# Patient Record
Sex: Female | Born: 1935
Health system: Southern US, Community
[De-identification: ages and names within clinical notes are randomized; demographics above are authoritative.]

## PROBLEM LIST (undated history)

## (undated) DIAGNOSIS — I509 Heart failure, unspecified: Secondary | ICD-10-CM

## (undated) DIAGNOSIS — R413 Other amnesia: Secondary | ICD-10-CM

## (undated) DIAGNOSIS — I10 Essential (primary) hypertension: Secondary | ICD-10-CM

## (undated) DIAGNOSIS — M1990 Unspecified osteoarthritis, unspecified site: Secondary | ICD-10-CM

## (undated) DIAGNOSIS — I4891 Unspecified atrial fibrillation: Secondary | ICD-10-CM

## (undated) DIAGNOSIS — E0590 Thyrotoxicosis, unspecified without thyrotoxic crisis or storm: Secondary | ICD-10-CM

## (undated) DIAGNOSIS — E7800 Pure hypercholesterolemia, unspecified: Secondary | ICD-10-CM

## (undated) HISTORY — PX: TUBAL LIGATION: SHX77

## (undated) HISTORY — DX: Unspecified osteoarthritis, unspecified site: M19.90

## (undated) HISTORY — DX: Thyrotoxicosis, unspecified without thyrotoxic crisis or storm: E05.90

## (undated) HISTORY — DX: Unspecified atrial fibrillation: I48.91

## (undated) HISTORY — DX: Heart failure, unspecified: I50.9

## (undated) HISTORY — DX: Other amnesia: R41.3

## (undated) HISTORY — PX: APPENDECTOMY: SHX54

---

## 2000-04-22 ENCOUNTER — Inpatient Hospital Stay (HOSPITAL_COMMUNITY): Admission: EM | Admit: 2000-04-22 | Discharge: 2000-04-25 | Payer: Self-pay | Admitting: Emergency Medicine

## 2000-04-27 ENCOUNTER — Inpatient Hospital Stay (HOSPITAL_COMMUNITY): Admission: AD | Admit: 2000-04-27 | Discharge: 2000-05-01 | Payer: Self-pay | Admitting: Emergency Medicine

## 2000-04-27 ENCOUNTER — Encounter: Payer: Self-pay | Admitting: Emergency Medicine

## 2000-07-30 ENCOUNTER — Emergency Department (HOSPITAL_COMMUNITY): Admission: EM | Admit: 2000-07-30 | Discharge: 2000-07-30 | Payer: Self-pay | Admitting: Emergency Medicine

## 2003-02-27 ENCOUNTER — Emergency Department (HOSPITAL_COMMUNITY): Admission: EM | Admit: 2003-02-27 | Discharge: 2003-02-27 | Payer: Self-pay

## 2003-04-14 ENCOUNTER — Emergency Department (HOSPITAL_COMMUNITY): Admission: EM | Admit: 2003-04-14 | Discharge: 2003-04-14 | Payer: Self-pay | Admitting: Emergency Medicine

## 2003-04-14 ENCOUNTER — Encounter: Payer: Self-pay | Admitting: Emergency Medicine

## 2004-01-21 ENCOUNTER — Ambulatory Visit (HOSPITAL_COMMUNITY): Admission: RE | Admit: 2004-01-21 | Discharge: 2004-01-21 | Payer: Self-pay | Admitting: Family Medicine

## 2004-01-21 ENCOUNTER — Encounter: Payer: Self-pay | Admitting: Cardiology

## 2004-02-22 ENCOUNTER — Emergency Department (HOSPITAL_COMMUNITY): Admission: EM | Admit: 2004-02-22 | Discharge: 2004-02-22 | Payer: Self-pay | Admitting: Emergency Medicine

## 2004-09-06 ENCOUNTER — Emergency Department (HOSPITAL_COMMUNITY): Admission: EM | Admit: 2004-09-06 | Discharge: 2004-09-06 | Payer: Self-pay | Admitting: Emergency Medicine

## 2004-09-13 ENCOUNTER — Emergency Department (HOSPITAL_COMMUNITY): Admission: EM | Admit: 2004-09-13 | Discharge: 2004-09-13 | Payer: Self-pay | Admitting: Emergency Medicine

## 2004-11-24 ENCOUNTER — Encounter: Admission: RE | Admit: 2004-11-24 | Discharge: 2004-11-24 | Payer: Self-pay | Admitting: Family Medicine

## 2004-11-24 ENCOUNTER — Encounter (INDEPENDENT_AMBULATORY_CARE_PROVIDER_SITE_OTHER): Payer: Self-pay | Admitting: Radiology

## 2004-11-24 ENCOUNTER — Encounter (INDEPENDENT_AMBULATORY_CARE_PROVIDER_SITE_OTHER): Payer: Self-pay | Admitting: *Deleted

## 2004-12-02 ENCOUNTER — Encounter: Admission: RE | Admit: 2004-12-02 | Discharge: 2004-12-02 | Payer: Self-pay | Admitting: General Surgery

## 2004-12-05 ENCOUNTER — Ambulatory Visit: Payer: Self-pay | Admitting: Oncology

## 2004-12-08 ENCOUNTER — Encounter (INDEPENDENT_AMBULATORY_CARE_PROVIDER_SITE_OTHER): Payer: Self-pay | Admitting: Diagnostic Radiology

## 2004-12-08 ENCOUNTER — Encounter (INDEPENDENT_AMBULATORY_CARE_PROVIDER_SITE_OTHER): Payer: Self-pay | Admitting: Specialist

## 2004-12-08 ENCOUNTER — Encounter: Admission: RE | Admit: 2004-12-08 | Discharge: 2004-12-08 | Payer: Self-pay | Admitting: General Surgery

## 2004-12-08 ENCOUNTER — Encounter: Payer: Self-pay | Admitting: Oncology

## 2004-12-29 ENCOUNTER — Ambulatory Visit (HOSPITAL_COMMUNITY): Admission: RE | Admit: 2004-12-29 | Discharge: 2004-12-30 | Payer: Self-pay | Admitting: General Surgery

## 2004-12-29 ENCOUNTER — Encounter (INDEPENDENT_AMBULATORY_CARE_PROVIDER_SITE_OTHER): Payer: Self-pay | Admitting: Specialist

## 2005-02-10 ENCOUNTER — Ambulatory Visit: Payer: Self-pay | Admitting: Oncology

## 2005-02-21 ENCOUNTER — Ambulatory Visit: Admission: RE | Admit: 2005-02-21 | Discharge: 2005-03-13 | Payer: Self-pay | Admitting: Radiation Oncology

## 2005-03-03 ENCOUNTER — Ambulatory Visit (HOSPITAL_COMMUNITY): Admission: RE | Admit: 2005-03-03 | Discharge: 2005-03-03 | Payer: Self-pay | Admitting: Oncology

## 2005-05-25 ENCOUNTER — Ambulatory Visit: Payer: Self-pay | Admitting: Oncology

## 2005-11-23 ENCOUNTER — Ambulatory Visit: Payer: Self-pay | Admitting: Oncology

## 2006-02-13 ENCOUNTER — Encounter: Admission: RE | Admit: 2006-02-13 | Discharge: 2006-02-13 | Payer: Self-pay | Admitting: General Surgery

## 2006-05-17 ENCOUNTER — Ambulatory Visit: Payer: Self-pay | Admitting: Oncology

## 2006-10-30 ENCOUNTER — Encounter: Admission: RE | Admit: 2006-10-30 | Discharge: 2006-10-30 | Payer: Self-pay | Admitting: Cardiology

## 2007-01-11 ENCOUNTER — Emergency Department (HOSPITAL_COMMUNITY): Admission: EM | Admit: 2007-01-11 | Discharge: 2007-01-12 | Payer: Self-pay | Admitting: Emergency Medicine

## 2007-07-01 ENCOUNTER — Ambulatory Visit (HOSPITAL_COMMUNITY): Admission: RE | Admit: 2007-07-01 | Discharge: 2007-07-01 | Payer: Self-pay | Admitting: Cardiology

## 2007-12-18 ENCOUNTER — Encounter: Admission: RE | Admit: 2007-12-18 | Discharge: 2007-12-18 | Payer: Self-pay | Admitting: Cardiology

## 2007-12-24 ENCOUNTER — Ambulatory Visit: Payer: Self-pay | Admitting: Oncology

## 2008-02-17 ENCOUNTER — Emergency Department (HOSPITAL_COMMUNITY): Admission: EM | Admit: 2008-02-17 | Discharge: 2008-02-18 | Payer: Self-pay | Admitting: Emergency Medicine

## 2008-02-17 ENCOUNTER — Encounter: Admission: RE | Admit: 2008-02-17 | Discharge: 2008-02-17 | Payer: Self-pay | Admitting: Oncology

## 2008-02-18 ENCOUNTER — Emergency Department (HOSPITAL_COMMUNITY): Admission: EM | Admit: 2008-02-18 | Discharge: 2008-02-18 | Payer: Self-pay | Admitting: Emergency Medicine

## 2008-06-06 ENCOUNTER — Emergency Department (HOSPITAL_COMMUNITY): Admission: EM | Admit: 2008-06-06 | Discharge: 2008-06-06 | Payer: Self-pay | Admitting: Emergency Medicine

## 2008-06-23 ENCOUNTER — Inpatient Hospital Stay (HOSPITAL_COMMUNITY): Admission: RE | Admit: 2008-06-23 | Discharge: 2008-06-30 | Payer: Self-pay | Admitting: Orthopedic Surgery

## 2008-06-25 ENCOUNTER — Encounter (INDEPENDENT_AMBULATORY_CARE_PROVIDER_SITE_OTHER): Payer: Self-pay | Admitting: Orthopedic Surgery

## 2008-06-25 ENCOUNTER — Ambulatory Visit: Payer: Self-pay | Admitting: Vascular Surgery

## 2008-06-26 ENCOUNTER — Ambulatory Visit: Payer: Self-pay | Admitting: Physical Medicine & Rehabilitation

## 2008-08-10 ENCOUNTER — Encounter: Admission: RE | Admit: 2008-08-10 | Discharge: 2008-08-10 | Payer: Self-pay | Admitting: Orthopedic Surgery

## 2008-10-02 HISTORY — PX: BREAST SURGERY: SHX581

## 2008-12-18 ENCOUNTER — Ambulatory Visit: Payer: Self-pay | Admitting: Oncology

## 2008-12-22 LAB — CBC WITH DIFFERENTIAL/PLATELET
BASO%: 1 % (ref 0.0–2.0)
EOS%: 4.3 % (ref 0.0–7.0)
LYMPH%: 39.3 % (ref 14.0–49.7)
MCHC: 32.1 g/dL (ref 31.5–36.0)
MCV: 83.9 fL (ref 79.5–101.0)
MONO%: 7.1 % (ref 0.0–14.0)
NEUT#: 1.9 10*3/uL (ref 1.5–6.5)
Platelets: 111 10*3/uL — ABNORMAL LOW (ref 145–400)
RBC: 4.09 10*6/uL (ref 3.70–5.45)
RDW: 15.3 % — ABNORMAL HIGH (ref 11.2–14.5)
WBC: 4 10*3/uL (ref 3.9–10.3)

## 2008-12-22 LAB — COMPREHENSIVE METABOLIC PANEL
ALT: 16 U/L (ref 0–35)
AST: 19 U/L (ref 0–37)
Albumin: 3.6 g/dL (ref 3.5–5.2)
Alkaline Phosphatase: 105 U/L (ref 39–117)
Potassium: 3.8 mEq/L (ref 3.5–5.3)
Sodium: 140 mEq/L (ref 135–145)
Total Bilirubin: 0.4 mg/dL (ref 0.3–1.2)
Total Protein: 6.5 g/dL (ref 6.0–8.3)

## 2009-02-17 ENCOUNTER — Encounter: Admission: RE | Admit: 2009-02-17 | Discharge: 2009-02-17 | Payer: Self-pay | Admitting: Oncology

## 2009-03-19 ENCOUNTER — Ambulatory Visit: Payer: Self-pay | Admitting: Oncology

## 2009-03-26 LAB — CBC & DIFF AND RETIC
Basophils Absolute: 0 10*3/uL (ref 0.0–0.1)
EOS%: 2.9 % (ref 0.0–7.0)
Eosinophils Absolute: 0.1 10*3/uL (ref 0.0–0.5)
HCT: 37 % (ref 34.8–46.6)
HGB: 12.1 g/dL (ref 11.6–15.9)
IRF: 0.34 — ABNORMAL HIGH (ref 0.130–0.330)
MCH: 27.8 pg (ref 25.1–34.0)
MCV: 84.9 fL (ref 79.5–101.0)
MONO%: 7.5 % (ref 0.0–14.0)
NEUT#: 1.8 10*3/uL (ref 1.5–6.5)
NEUT%: 48.4 % (ref 38.4–76.8)
RDW: 14.6 % — ABNORMAL HIGH (ref 11.2–14.5)
RETIC #: 44 10*3/uL (ref 19.7–115.1)

## 2009-03-26 LAB — VITAMIN B12: Vitamin B-12: 1407 pg/mL — ABNORMAL HIGH (ref 211–911)

## 2009-03-26 LAB — MORPHOLOGY: PLT EST: DECREASED

## 2009-03-26 LAB — FOLATE: Folate: >20 ng/mL

## 2009-03-26 LAB — FERRITIN: Ferritin: 161 ng/mL (ref 10–291)

## 2009-07-15 ENCOUNTER — Ambulatory Visit: Payer: Self-pay | Admitting: Oncology

## 2009-10-02 HISTORY — PX: JOINT REPLACEMENT: SHX530

## 2010-06-30 ENCOUNTER — Ambulatory Visit: Payer: Self-pay | Admitting: Oncology

## 2010-07-04 LAB — CBC WITH DIFFERENTIAL/PLATELET
BASO%: 0.6 % (ref 0.0–2.0)
EOS%: 2.6 % (ref 0.0–7.0)
HCT: 36.7 % (ref 34.8–46.6)
MCH: 29.1 pg (ref 25.1–34.0)
MCHC: 33.5 g/dL (ref 31.5–36.0)
MONO#: 0.4 10*3/uL (ref 0.1–0.9)
NEUT%: 54.2 % (ref 38.4–76.8)
RBC: 4.22 10*6/uL (ref 3.70–5.45)
RDW: 14.9 % — ABNORMAL HIGH (ref 11.2–14.5)
WBC: 4.5 10*3/uL (ref 3.9–10.3)
lymph#: 1.5 10*3/uL (ref 0.9–3.3)

## 2010-07-04 LAB — COMPREHENSIVE METABOLIC PANEL
ALT: 16 U/L (ref 0–35)
AST: 25 U/L (ref 0–37)
Albumin: 4 g/dL (ref 3.5–5.2)
CO2: 26 mEq/L (ref 19–32)
Calcium: 9.1 mg/dL (ref 8.4–10.5)
Chloride: 105 mEq/L (ref 96–112)
Potassium: 3.9 mEq/L (ref 3.5–5.3)
Sodium: 140 mEq/L (ref 135–145)
Total Protein: 6.8 g/dL (ref 6.0–8.3)

## 2010-07-04 LAB — CANCER ANTIGEN 27.29: CA 27.29: 31 U/mL (ref 0–39)

## 2010-09-20 ENCOUNTER — Encounter
Admission: RE | Admit: 2010-09-20 | Discharge: 2010-09-20 | Payer: Self-pay | Source: Home / Self Care | Attending: Oncology | Admitting: Oncology

## 2010-10-23 ENCOUNTER — Encounter: Payer: Self-pay | Admitting: Oncology

## 2011-02-14 NOTE — Op Note (Signed)
[name], [name]             ACCOUNT NO.:  [id number]   MEDICAL RECORD NO.:  [id number]          PATIENT TYPE:  INP   LOCATION:  5013                         FACILITY:  [facility]   PHYSICIAN:  Lenard Galloway. Mortenson, M.D.DATE OF BIRTH:  03-22-1932   DATE OF PROCEDURE:  06/23/2008  DATE OF DISCHARGE:                               OPERATIVE REPORT   PREOPERATIVE DIAGNOSIS:  End-stage osteoarthritis left knee.   POSTOPERATIVE DIAGNOSIS:  End-stage osteoarthritis left knee.   OPERATION:  Left total knee with computer navigation using a DePuy  standard plus cemented left femoral component and a standard plus  cemented 10-mm poly insert with a standard plus cemented metal-backed  patella and an MBT Keel tibial tray size 3 cemented.   SURGEON:  Lenard Galloway. Chaney Malling, MD   ASSISTANT:  Oris Drone. Petrarca, PA-C   ANESTHESIA:  General.   PROCEDURE:  The patient was placed on the operating table in a supine  position with a pneumatic tourniquet above the left upper thigh.  The  entire left lower extremity was prepped with DuraPrep and draped out in  usual manner.  A Vi-Drape was placed over the operative site.  The leg  was wrapped down in Esmarch and tourniquet was elevated.  An incision  made starting above the patella and carried down to the tibial tubercle.  Skin edges were retracted.  Bleeders were coagulated.  A long medial  parapatellar incision was made and the patella was everted.  Large  osteophytes were seen over both medial and lateral condyles and these  were debrided.  The osteophytes around the rim of the patella were also  removed.  The femur was then sized to be a standard plus size.  A  partial synovectomy was done.  Knee was flexed.  The cruciate ligaments  were released and both medial and lateral meniscus were excised.  Once  the soft tissue was debrided and access was achieved, Schanz pins were  placed in the distal femur and proximal tibia in a standard manner and  the  femoral and tibial arrays were applied.  Registration was then done  to find the mechanical axis first the femoral head, then the tibial  mechanical axis.  Then we defined the proximal tibial plateau and the  femoral condyles and epicondyles.  This was all inserted into computer  and then tibial plane was done.  The tibial resection settings were then  registered and a tibial resection guide was placed over the anterior  aspect of the tibia using the computer prompting.  The block was held in  appropriate position and fixation pins were inserted.  A capture guide  was then placed on the cutting block and the proximal end of the tibia  was resected.  A confirmation guide was then used and final  _tibial_________cuts were made in  the proximal tibia.  At this point,  the tibial array became loose and could not be stabilized and the tibial  and femoral arrays were removed and we proceeded without the computer.  The attention was then turned to the distal femur.  The knee flexed 90  degrees, the tibial cutting block #1 was placed over the distal end of  the femur.  A drill hole was placed through the cutting block up into  the femoral canal and this was locked in position.  Anterior and  posterior cuts were then made.  Once this was accomplished, insert was  put in the knee with the knee flexed 90 degrees and there was excellent  soft tissue balancing of both the medial, lateral and collateral  ligaments.  Cutting block #2 was placed over the distal end of the femur  on top of the intramedullary rod.  This was put in position and the knee  was put in extension and 10 mm spacer block was put in place.  So, a  proper length was been amputated.  The cutting block was locked in  position with fixation pins and distal end of the femur was amputated.  Once this was accomplished, the cutting block was removed and the spacer  block was put in position.  A 10-mm spacer block balanced the collateral   ligaments extremely well.  There was excellent alignment.  The long rod  was placed down through the handle of the spacer block and this lined up  very nice between the first and second metatarsals.  There was excellent  stability with varus and valgus stress in flexion and extension.  The  final femoral cutting block was placed over the distal end of the femur  and locked in position.  Chamfer cuts were made and drill holes were  made.  Once this was accomplished, attention was then turned to the  proximal tibia.  The tibia was subluxed anteriorly using __________  retractors.  A size 3 tibial tray was placed over the proximal end of  the tibia and fixation pins were put in place.  The smoke stack drill  guide was locked in position and a drill was placed on proximal tibia.  The smoke stack was removed and the winged keel broach was passed down  the proximal tibia.  Once this was accomplished, a 10-mm trial poly was  inserted and the standard plus femoral component was selected and put  over the distal end of the femur.  Knee was then articulated and put  through a full range of motion.  There was excellent stability in  extension and with flexion, there was excellent alignment of poly.  There was no tendency to sublux.  Her AP drawer was negative, no  rotation abnormality was noted.  Knee was put through full range of  motion and extremely stable.  Attention was then turned to the posterior  aspect of the patella.  Cutting block was placed to the posterior  aspect.  Posterior aspect of the patella was amputated and drill guide  was placed and three holes were made in the cut surface of the patella.  The patella trial was inserted and the knee articulated again.  There  was tightness in the lateral ligaments and a small lateral release was  done.  Once this was accomplished, the patella tracked very nicely in  midline.  All the trial components were removed.  The knee was then  irrigated  with copious amounts of saline solution, using pulsatile  lavage, all debris was removed.  Once this was accomplished to my  satisfaction, the final component was selected, put on the back table  and glue was mixed.  Glue was inserted on the posterior aspect of all  the components.  Attention then turned to the proximal end of the cut  surface of the tibia.  Excess glue was placed over the cut surface and  passed down into the medullary hole.  A tibial plate was inserted and  impacted tightly and excess glue was removed.  Trial poly was inserted  and glue was placed over the distal end of the femur in posterior aspect  of the femoral component.  The femoral component was then reduced and  excess glue was removed.  The knee was put through full range of motion  and again there was excellent stability.  The leg was held in extension  and glue was placed in posterior aspect of the patella.  The patella  component was then inserted and held in place with a patella clamp.  All  excess glue was removed with small curettes and glue removers.  Once the  glue had cured, the trial poly was removed from the tibial plate.  Knee  was flexed and excess glue was removed with an osteotome.  The knee was  irrigated again with copious amounts of saline solution.  All debris was  removed.  FloSeal was inserted and allowed to set up.  Once this was  accomplished, tourniquet was dropped and bleeders were coagulated.  There was no acute bleeding posterior over the posterior capsule.  The  Bovie was used.  Bone wax was used to reduce bleeding from the drill  holes in the femur where the Schanz pins had been removed.  A final  tibial poly was then inserted in place and reduced.  The knee was put  through a full range of motion.  There was very little bleeding.  A long  medial parapatellar incision that was closed with interrupted heavy  Ethibond sutures.  Vicryl was used to close the subcutaneous tissue and   stainless steel staples were used to close the skin.  Sterile dressings  were applied and the patient returned to recovery room in excellent  condition.  Technically, this went extremely well.   DRAINS:  None.   COMPLICATIONS:  None.      Rodney A. Chaney Malling, M.D.  Electronically Signed     RAM/MEDQ  D:  06/23/2008  T:  06/24/2008  Job:  [id number]

## 2011-02-14 NOTE — Discharge Summary (Signed)
[name], [name]             ACCOUNT NO.:  [id number]   MEDICAL RECORD NO.:  [id number]          PATIENT TYPE:  INP   LOCATION:  5013                         FACILITY:  [facility]   PHYSICIAN:  Lenard Galloway. Mortenson, M.D.DATE OF BIRTH:  Jun 29, 1932   DATE OF ADMISSION:  06/23/2008  DATE OF DISCHARGE:  06/29/2008                               DISCHARGE SUMMARY   ADMISSION DIAGNOSIS:  Osteoarthritis of left knee.   DISCHARGE DIAGNOSES:  1. Osteoarthritis of left knee.  2. History of hypertension.  3. History of right breast cancer.  4. Acute blood loss anemia.   PROCEDURE:  Left total knee arthroplasty.   HISTORY:  Cynthia Copeland is a 77 year old African American female with  long standing left leg and left knee pain.  The pain is severe, sharp,  stabbing and aching pain.  It is worsening and she now is having  problems with ambulation and activities of daily living.  She now uses  narcotics for pain.  She does have a limp.  Radiographic end-stage  osteoarthritis of left knee.  Has failed conservative treatment and is  for left total knee arthroplasty.   HOSPITAL COURSE:  A 77 year old female admitted June 23, 2008 after  appropriate laboratory studies were obtained, as well as 1 gram of  vancomycin IV on call to the operating room, was taken to the operating  room where she underwent a left total knee arthroplasty with computer  navigation.  She tolerated the procedure well.  She was continued on  vancomycin 1 gram IV q.12h. x1 more dose.  The patient was placed on a  reduced dose of Dilaudid PCA pump for pain management.  Arixtra 2.5 mg  subcutaneously Q. 8 A.M. beginning on June 24, 2008.  Foley was  placed intraoperatively.  Consults with PT, OT and care management were  made.  She was allowed to be weight-bearing as tolerated on the left.  CPM 0 to 60 degrees for 6 to 8 hours per day, increasing by 5 to 10  degrees per day.  She was allowed out of bed to chair the  following day.  She did have some decrease in her urine output.  IV fluids were  increased.  Her BUN and creatinine had risen in the first several days  postoperatively but then slowly returned to normal.  We did have some  difficulty with pain management on her and we started her on Butorphanol  nasal spray, one spray in one nostril q.4h. p.r.n. severe pain.  Her  hemoglobin had dropped to 7.2 on June 25, 2008 and she was typed  and crossed for 2 units of packed red blood cells and this was given,  each over two to three hours.  Lasix 10 mg IV between the units was  given.  Her dressing was changed.  Her wounds remained clean and dry  during her hospital course. A Doppler was done of the left lower leg on  June 25, 2008 and this was negative.  On June 25, 2008 she  also informed us that her grandson, who was to be taking care of her at  home, was going out of town for two weeks and would no longer be able to  take care of her and therefore she wanted to have rehab consult.  At  that time we asked for a rehab consult on June 26, 2008 and it was  felt that she was a skilled nursing facility candidate.  On June 26, 2008 we did do a HIT screen which was positive on the screen but  after a discussion with the pharmacist, it was felt that the Arixtra was  continuing to be safe for this patient since it is not a heparin based  product.  Urinalysis was also ordered on June 26, 2008.  The  remainder of her hospital stay over the weekend was unremarkable.  It  was felt that she was a candidate for discharge to a skilled nursing  facility as soon as a bed was available.   LABORATORY DATA:  She was admitted with a urine culture that showed no  growth preoperatively.  Blood type was O positive, antibody screen  negative.  Preoperative hemoglobin was 12.1, hematocrit 36.4%, white  count 4,300, platelet count 206,000.  Discharge hemoglobin was 9.4,  hematocrit 27.9,  white blood cell count 7,400, platelet count 104,000.  Preoperative sodium was 139, potassium 4.2, chloride 104, CO2 29,  glucose 96, BUN 17, creatinine 0.73.  GFR greater than 20.  Total  bilirubin 0.7, alkaline phosphatase 85, SGOT 23, SGPT 17,  total protein  7.0, albumin 3.5 and calcium 8.9.  Discharge sodium 137, potassium 3.7,  chloride 105, CO2 25, glucose 113, BUN 11, creatinine 0.99.  On  radiographic studies, chest x-ray of Feb 18, 2008 revealed stable  cardiomegaly with previous right mastectomy, no acute findings.  EKG of  June 23, 2008 reveals a sinus rhythm with short PR, otherwise  normal.   DISCHARGE INSTRUCTIONS:   WOUND CARE:  She is to keep the incision clean and dry, cover the wound  daily with 4x4's.  She may shower.   DIET:  She may be on a regular diet.   ACTIVITY:  She can increase her activity slowly.  No lifting or driving  for six weeks.  Physical therapy for ambulation, weight-bearing as  tolerated with a walker.  Also to do range of motion strengthening  exercises.  CPM 0 to 80 degrees for 6 to 8 hours per day, increasing 5  to 10 degrees per day.   DISCHARGE MEDICATIONS:  1. Prescription for Percocet 5/325 one to two tabs every 4 hours as      needed for pain.  2. Robaxin 500 mg one tablet every 8 hours as needed for spasms.  3. Arixtra 2.5 mg injected daily at 8 A.M.; last dose being June 30, 2008.  At that time, she is to begin aspirin 81 mg daily on      July 01, 2008 for one month.  4. She is to continue her amlodipine.  5. Benazepril 20 mg q.a.m. Please check with her to make sure the      dosing is right on this, once daily.  6. Clonidine 0.1 mg b.i.d.  7. Tamoxifen 20 mg one daily.  8. Butorphanol 10 mg one spray in one nostril q.4-6h. p.r.n.      breakthrough pain.  This should be used only on a very limited      basis.   FOLLOWUP:  She will need followup with Dr. Chaney Malling on July 06, 2008.  An  appointment can be  made by calling [phone].  Call for any problems  with increasing temperature, wound changes or calf pain which is  increasing.   CONDITION ON DISCHARGE:  She is discharged in improved condition.      Cynthia Copeland, P.A.-C.      Rodney A. Chaney Malling, M.D.  Electronically Signed    Cynthia Copeland/MEDQ  D:  06/29/2008  T:  06/29/2008  Job:  [id number]

## 2011-02-17 NOTE — Discharge Summary (Signed)
[place]  Patient:    Cynthia Copeland, Cynthia Copeland                    MRN: [id number] Adm. Date:  [id number] Disc. Date: [id number] Attending:  Roque Lias Dictator:   Dianah Field, P.A.C. CC:         Hedwig Morton. Juanda Chance, M.D. [facility]             Reuben Likes, M.D.                           Discharge Summary  DATE OF BIRTH:  12-19-1931.  ADMISSION DIAGNOSES: 1. Acute gastrointestinal bleed felt to be upper intestinal in origin.  No    associated abdominal pain. 2. Elevated BUN likely secondary to blood within the gastrointestinal tract. 3. High blood pressure. 4. Anemia secondary to gastrointestinal bleed.  Rule out additional chronic    anemia secondary to chronic gastrointestinal blood loss. 5. Status post total abdominal hysterectomy, status post appendectomy. 6. Rule out urinary tract infection. 7. Status post left knee arthroscopy.  DISCHARGE DIAGNOSES: 1. Gastrointestinal bleed secondary to antral gastric ulcer. 2. Gastritis.  CLOtest biopsy negative. 3. Anemia associated with gastrointestinal bleed.  Status post transfusion    with two units of packed red blood cells. 4. Azotemia secondary to blood within the gastrointestinal tract, resolved. 5. Iron deficiency.  The patient started on iron supplementation during this    admission.  Folate and B12 levels within normal limits. 6. Rule out urinary tract infection.  Urine culture with no growth during this    admission.  PROCEDURES:  Upper endoscopy by Hedwig Morton. Juanda Chance, M.D., on April 23, 2000. Findings included benign-appearing gastric ulcer in the antrum with a hemorrhagic spot consistent with recent bleeding.  Gastritis.  CONSULTATIONS:  Reuben Likes, M.D., followed the patient for hypertension while she was hospitalized.  BRIEF HISTORY:  Cynthia Copeland is a 77 year old African-American female who developed painless large volume loose bloody stools.  These occurred on the day of  admission.  She had no associated nausea, vomiting or abdominal pain. However, she did fell she was complaining of weakness last week and dizziness near the time of admission. She had not had prior blood per rectum or melanic stool.  She gave a history of heartburn and indigestion which she treats p.r.n. with Alka-Seltzer.  She was also using daily aspirin along with occasional nonsteroidal Aleve or ibuprofen.  On arrival to the emergency room blood pressure was actually elevated at 139/101.  Pulse was 96.  She was mentating well and in no distress.  Dr. Lina Sar admitted the patient for further work-up and supportive care.  LABORATORIES:  Initial hemoglobin was 8.7.  It dropped down to 7.4 and measured 9.5 at discharge.  Hematocrit low of 22.8, MCV normal at 83.5, platelets initially 168, did drop to 129.  PT was 14.7, INR 1.3, PTT 26. Fecal occult blood testing was positive.  Helicobacter screening was negative for evidence for H. pylori.  Urinalysis was contaminated with many epithelial cells, moderate amount of leukocyte esterase and many bacteria were present. However, urine culture showed no growth.  The iron level was reduced at 36. Total iron binding capacity normal at 263 and iron saturation reduce at 14. B12 was 564, folate 17.7, both of which are normal.  HOSPITAL COURSE:  The patient was admitted in the afternoon of April 22, 2000.  She was up to a telemetry bed on 5500 by that evening.  She was started on IV fluids in the emergency room and these were continued.  Serial hemoglobin and hematocrits were obtained.  Ultimately the patient required transfusions of two units of packed red blood cells for drop in her hemoglobin.  She was limited to clear liquid diet.  The patient underwent her upper endoscopy by Dr. Juanda Chance with findings noted above.  Because of the visible vessel within the ulcer bed, she was at higher risk for rebleeding and thus was maintained on telemetry  monitoring with serial CBCs.  She did not require further transfusions.  She did not become unstable.  Blood pressure initially had been elevated and she was restarted on her usual hydrochlorothiazide.  She was also started on Inderal inadvertently because of misunderstanding as to what medication she took, Dr. Juanda Chance wrote for this medication.  However, per Dr. Ginny Forth records, she does not use this medication.  This presented a problem as the Inderal was felt to have induced some noticeable fatigue on the patients part and was discontinued on the day of her discharge.  Blood pressure was kept under good control and she had no evidence for rebleeding for at least 48 hours prior to discharge.  Stools were heme-positive on the day of her discharge but by visual observation they were not melanic.  She had been started on a regimen of Nu-Iron in order to supplement her iron deficiency.  She was tolerating a solid food diet and was discharged to home in the afternoon of April 25, 2000.  CONDITION AT DISCHARGE:  Stable and improved.  FOLLOW-UP:  The patient is to call Dr. Ginny Forth office to arrange office visit for the end of this present week for recheck of her blood pressure.  Regarding GI, she has an appointment on Monday, May 14, 2000, at the [facility] third floor [facility] office for preoperative clearance in anticipation of upper endoscopy which has been set up for May 29, 2000, at 2 p.m., also at [facility].  DIET AT DISCHARGE:  She will be on a regular diet.  MEDICATIONS AT DISCHARGE: 1. Protonix 40 mg one p.o. q.d. 2. Nu-Iron one p.o. q.d. 3. Hydrochlorothiazide 50 mg one p.o. q.d. 4. Vitamins A, B and B12 can be taken as they were before. 5. Regarding vitamin C, she is to restart this in two weeks. 6. Regarding aspirin, she is not to restart her daily 325 mg aspirin until    one of her physicians says it is okay to restart this. 7. She is also not to use any Aleve or  ibuprofen. 8. She can use Tylenol if needed for analgesia. DD:  04/25/00 TD:  04/27/00 Job: [id number]  [id number]

## 2011-02-17 NOTE — Procedures (Signed)
[place]. [facility]  Patient:    Cynthia Copeland, Cynthia Copeland                    MRN: [id number] Proc. Date: 04/23/00 Adm. Date:  [id number] Attending:  Roque Lias CC:         Reuben Likes, M.D.                           Procedure Report  PROCEDURE:  Upper endoscopy.  ENDOSCOPIST:  Hedwig Morton. Juanda Chance, M.D. [facility]  INDICATIONS:  This 77 year old white female patient of Dr. Lorenz Coaster presented with painless, large-volume hematochezia, mostly dark red stool which was loose.  She had an episode of dizziness about a week prior to that, but she denied any abdominal pain.  The patient has been on aspirin 1 a day, 81 mg alternating with 325 mg.  She has no previous GI history.  Her hemoglobin was 8.7 g on admission.  Her BUN was 39, indicating possible upper GI source of bleeding.  Her stool was clearly hemoccult positive.  Her hemoglobin dropped to 7.4 during the night.  She has passed another melanic stool.  The patient is undergoing upper endoscopy for further localization of her bleeding.  ENDOSCOPE:  Fujinon single-channel endoscope.  SEDATION:  Versed 10 mg IV.  FINDINGS:  Esophagus.  DESCRIPTION OF PROCEDURE:  The Fujinon single-channel endoscope was passed through the posterior pharynx into the esophagus.  The patient was monitored by pulse oximeter.  Her oxygen saturations were normal.  Proximal and distal esophageal mucosa was unremarkable with normal-appearing squamocolumnar junction.  There was no hiatal hernia.  Stomach: The stomach was insufflated with air.  There was no blood in the stomach, but mucosa of the gastric antrum was erythematous with several erosions.  There were some specks of old blood coating the gastric antrum. There was a 1 cm shallow gastric ulcer with a visible vessel which was not bleeding.  There was a flat red spot which did not protrude.  There was no blood clot or heaped up material.  ______ were clean and flat.   Surrounding mucosa was somewhat friable.  Pyloric cap was normal.  Duodenum:  The duodenal bulb and descending duodenum were normal.  CLOtest was taken from gastric antrum.  Retroperitoneal view endoscope revealed normal fundus and cardia.  Endoscope was then withdrawn.  The stomach was decompressed.  The patient tolerated the procedure well.  IMPRESSION: 1. Benign-appearing gastric ulcer in the gastric antrum with hemorrhagic spot    consistent with recent bleeding. 2. Gastritis status post CLOtest.  PLAN:  Since the patient is not bleeding actively, I would treat her conservatively with bowel rest and acid suppressing agents.  Await results from the CLOtest.  The patient was advised not to take any aspirin in the future.  Depending on the biopsies and CLOtest, she may need some long-term chronic acid suppressing agents. DD:  04/23/00 TD:  04/23/00 Job: [id number]

## 2011-02-17 NOTE — H&P (Signed)
[place]. [facility]  Patient:    Cynthia Copeland, Cynthia Copeland                    MRN: [id number] Adm. Date:  [id number] Attending:  Roque Lias Dictator:   Dianah Field, P.A. CC:         Dr. Lorenz Coaster             Dr. Juanda Chance                         History and Physical  DATE OF BIRTH: 04-20-1932  CHIEF COMPLAINT: Painless bloody bowel movement.  HISTORY OF PRESENT ILLNESS: Ms. Cynthia Copeland is a pleasant 77 year old African-American female with no prior GI history.  She is generally healthy, with no active medical problems other than leg cramps.  On the day of admission at about mid day to early afternoon she passed a large volume of loose bloody stool which was dark red.  She had no associated abdominal cramps or pain.  She denies nausea or vomiting.  She denies dizziness or chest pain. She presented to the emergency room for evaluation and was referred to Dr. Lina Sar.  On evaluation her vital signs were stable.  She was alert and oriented and in no distress.  The abdomen was nontender.  There was maroon guaiac positive stool on rectal examination.  Hemoglobin was 8.7, WBC was normal, as were coags.  Her BUN was elevated at 39.  Dr. Juanda Chance admitted her for supportive care, serial blood counts, and upper and possibly lower endoscopy to locate the source of the GI bleed.  PAST MEDICAL HISTORY:  1. She has "young" cataracts, with preserved night vision.  2. She has a history of lower extremity cramping, which is worse on the left,     treated with Quinine.  3. She is status post left knee arthroscopy, which occasionally causes her     pain.  She does use Aleve to treat the pain two to three times weekly.  4. She is status post total abdominal hysterectomy.     a. History of hemorrhage perioperatively following hysterectomy requiring        transfusions with what sounds like packed red blood cells as well as        FFP.  The date of this surgery is  unclear but it was at least ten years        ago.  5. She is status post appendectomy.  6. Gravida 4, para 3, one spontaneous abortion.  ALLERGIES: No known drug allergies.  SOCIAL HISTORY: The patient has been a widow for nearly a year.  The anniversary of her husbands death is coming up this 2023/05/28.  She quit smoking ten years ago.  She does not consume alcoholic beverages.  She works at [employer] in [employer].  CURRENT MEDICATIONS:  1. Quinine sulfate as-needed for leg cramps.  2. She takes the following vitamins daily:     a. Vitamin A.     b. Vitamin D.     c. Vitamin C.     D. Vitamin B-12.  3. Over-the-counter iron about three times weekly.  4. Aleve and/or Advil as-needed two to three times weekly at the most.  5. Aspirin 325 mg 1 p.o. q.d.  FAMILY HISTORY: Her mother died at age 29 with a blood clot (sounds like DVT, questionable pulmonary embolus).  Father died at age 28  with old age.  He had adult onset diabetes mellitus.  REVIEW OF SYSTEMS: The patient wears glasses for reading and distance vision. She denies significant cough or shortness of breath.  She is able to mow her lawn and do all her yard work without significant problems.  She reports no chest pain with exertion.  She occasionally has abdominal bloating which she treats with p.r.n. Alka-Seltzer.  She has not used this in several weeks.  She denies urinary incontinence or dysuria.  She has left knee stiffness and pain on occasion.  PHYSICAL EXAMINATION:  GENERAL: The patient is a well-appearing African-American female who is alert and oriented.  She provides a good history.  VITAL SIGNS: Blood pressure 139/101, pulse 96, temperature 97.2 degrees, respirations 20.  Weight by scales is 76.9 kg.  HEENT: PERRL.  EOMI.  No scleral icterus.  No conjunctival pallor.  Oropharynx is moist and clear with many teeth missing.  NECK: Without mass or thyromegaly.  No bruits.  CHEST: Clear to  auscultation and percussion bilaterally.  COR: Rhythm is slightly rapid and regular without murmurs, rubs, or gallops.  ABDOMEN: Soft, nontender, with active bowel sounds.  Liver edge is smooth and palpable at the costal margin.  There is no costovertebral angle tenderness. No mass or hepatosplenomegaly.  There is a low vertical midline scar consistent with hysterectomy that is well-healed.  RECTAL: Notable for maroon guaiac positive stool.  No masses and good rectal tone.  EXTREMITIES: There is trace nonpitting edema of the left foot.  Dorsalis pedis pulses are 3+ bilaterally.  MUSCULOSKELETAL: Some slight swelling in the left knee which is nontender.  No gross joint deformities.  DERMATOLOGIC: No bruising or rashes or lesions.  NEUROLOGIC: No tremors.  The patient is able to stand and walk without assistance.  LABORATORY DATA: Urinalysis shows a small amount of leukocyte esterase and negative nitrites, 30 mg protein and 15 mg ketones present, large amount hemoglobin; many epithelial cells present; 6-10 wbc/hpf, 0-5 rbc/hpf; many bacteria on microscopic examination.  PT 14.7, INR 1.3, PTT 26 seconds.  WBC 6.6, hemoglobin 8.7, hematocrit 26.4, MCV 84.4; platelets 168,000. Electrolytes show a potassium of 3.7, sodium 140, glucose 134.  BUN elevated at 39, creatinine 0.8.  Albumin low at 2.6.  Bilirubin 0.7.  Alkaline phosphatase 75, AST 29, ALT 24.  IMPRESSION:  1. Gastrointestinal bleed.  Although there is red blood in the rectal     vault the patient does have elevated BUN which suggests an upper     gastrointestinal bleed.  Rule out aspirin related peptic ulcer disease.     Rule out lower gastrointestinal source of bleeding such as diverticulosis.  2. Anemia secondary to gastrointestinal bleed.  Questionable as to whether     she has had chronic blood loss on top of acute blood loss as she seems to     be tolerating the gastrointestinal bleed quite well, suggesting that her      blood counts may have been depressed to start with.   3. Hyperglycemia with family history of adult onset diabetes mellitus.  Needs     outpatient monitoring.  4. High blood pressure.  Needs monitoring.  5. Status post left knee arthroscopy with possible degenerative joint disease     in the left knee requiring periodic nonsteroidals.  PLAN: The patient is to be admitted to a telemetry monitored bed and we will plan to transfuse her if her hemoglobin reaches less than 8.5.  We will also check iron  studies.  Plan upper endoscopy within the next 12-24 hours and if this does not show a source for the bleed then she will need to undergo colonoscopy.  Plan to hold aspirin and begin Protonix IV and limit her diet to sips of clear liquids. DD:  04/23/00 TD:  04/23/00 Job: [id number]

## 2011-02-17 NOTE — H&P (Signed)
[place]. [facility]  Patient:    Cynthia Copeland, Cynthia Copeland                    MRN: [id number] Adm. Date:  [id number] Attending:  Roque Lias                         History and Physical  CHIEF COMPLAINT:  "Tired and dizzy."  HISTORY OF PRESENT ILLNESS:  The patient is a 77 year old female who came to my office today for a blood pressure recheck and was found to be in atrial fibrillation with rapid ventricular response.  She was hospitalized here for a GI bleed earlier this week.  Endoscopy revealed an ulcer.  She was transfused with packed red blood cells and discharged three days prior to admission. During her hospital stay she was monitored continuously by telemetry and was in normal sinus rhythm throughout her hospital stay up until the time of discharge. She returned to my office this morning for routine blood pressure checks since her blood pressure had been mildly elevated while in the hospital.  She stated that since she awoke this morning she felt tired, weak, fatigued and dizzy. She denied any sensation of rapid or irregular heart beat, chest pain, pressure or tightness or shortness of breath.  There was no known history of prior episodes of atrial fibrillation or any other cardiac problems and she has had no obvious GI bleeding since her discharge.  On physical examination her cardiac rhythm was rapid and irregular and EKG confirmed the presence of atrial fibrillation with rapid ventricular response. She is admitted now for further evaluation and rate control.  PAST SURGICAL HISTORY:  The patient had a cesarean section and Bilateral tubal ligation in 1997.  PAST MEDICAL HISTORY:  Other hospitalizations as above.  Illnesses: The patient has been hypertensive since 1997.  MEDICATIONS:  Hydrochlorothiazide 50 mg once a day and a GI medication once a day.  ALLERGIES:  The patient is allergic to penicillin.  FAMILY HISTORY:  The patients  father died at age 104 of complications of diabetes. Mother died at age 61 of a pulmonary embolus. She has one brother alive and well. Another brother died at age 27 possibly of exposure to toxic chemicals.  She has three sisters, one died at age 40 of breast cancer, the other two are alive and well and three children all alive and well.  SOCIAL HISTORY:  The patient was widowed 11 months ago.  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 She has never used alcohol.  She quit smoking in 1990 and has not used any tobacco since then.  REVIEW OF SYSTEMS:  Denies other systemic, skin, eye, ENT, respiratory, cardiovascular, GI, GU, musculoskeletal or neurological complaints.  PHYSICAL EXAMINATION:  VITAL SIGNS:  Blood pressure 120/70, pulse 150 and irregular, respirations 20, temperature 97.5.  GENERAL: Alert and in no distress.  SKIN: Warm and dry.  No rash.  HEENT: Eyes: Pupils equal, round and reactive to light.  Full EOMs.  Fundi benign. Sclerae nonicteric.  Conjunctivae slightly pale.  ENT:  TMs normal. No intraoral lesions. Pharynx clear.  Mucous membranes are moist.  NECK: Supple. No adenopathy, JVD or bruit. Thyroid normal.  Carotid pulses are full.  LUNGS:  Clear to auscultation and percussion.  HEART:  Rapid irregular rhythm without gallops, murmurs or rubs.  ABDOMEN: Soft and nontender without organomegaly  or mass.  Bowel sounds normally active.  RECTAL: No masses and stool hemoccult negative.  EXTREMITIES:  No edema.  Right pedal pulse was not felt.  NEUROLOGIC:  Alert, oriented times three.  Speech was clear and appropriate. No extremity weakness or tremor.  DTRs 2+ and symmetrical. Babinskis downgoing. Cranial nerves intact.  ADMITTING IMPRESSION: 1. Atrial fibrillation new onset with uncontrolled ventricular rate. 2. Duodenal ulcer with recent gastrointestinal bleed. 3. Hypertension.  PLAN: 1. Discuss  anticoagulation with Dr. Juanda Chance.  She felt it was probably    save to anticoagulate at this time. 2. Rate control with Cardizem drip. 3. Cardiology consult. DD:  04/27/00 TD:  04/28/00 Job: [id number]

## 2011-02-17 NOTE — Op Note (Signed)
NAMEMAEGEN, WIGLE             ACCOUNT NO.:  [id number]   MEDICAL RECORD NO.:  [id number]          PATIENT TYPE:  OIB   LOCATION:  5710                         FACILITY:  [facility]   PHYSICIAN:  Leonie Man, M.D.   DATE OF BIRTH:  [birth year]   DATE OF PROCEDURE:  12/29/2004  DATE OF DISCHARGE:                                 OPERATIVE REPORT   PREOPERATIVE DIAGNOSIS:  Multicentric invasive carcinoma of the right  breast.   POSTOPERATIVE DIAGNOSIS:  Multicentric invasive carcinoma of the right  breast.   PROCEDURE:  Modified radical mastectomy.   SURGEON:  Leonie Man, M.D.   ASSISTANT:  Cicero Duck, M.D.   ANESTHESIA:  General.   SPECIMENS FORWARDED TO THE PATHOLOGIST:  Right breast and axillary contents.   ESTIMATED BLOOD LOSS:  30 mL.   COMPLICATIONS:  None.   CONDITION TO THE POSTANESTHESIA CARE UNIT:  In good condition.   NOTE:  The patient is a 77 year old female discovered to have a large  palpable mass  in the upper-outer quadrant of the right breast; on MRI, she  is also noted to have a second lesion within the breast, both on core biopsy  show invasive carcinoma.  The patient comes to the operating room now after  the risks and potential benefits of lumpectomy, sentinel lymph node biopsy  and possible axillary lymph node dissection had been fully discussed with  her, all questions answered and consent obtained.   PROCEDURE:  Following the induction of satisfactory general anesthesia, with  the patient positioned supinely, the patient is positively identified as  Cynthia Copeland and her right breast is positively identified.  The right  breast is then prepped and draped to be included in the sterile operative  field.  I infiltrated the peri-areolar region with 5 mL of Lymphazurin dye;  prior to her coming to the operating room, she was also infiltrated with a  radioactive isotope so as to locate the sentinel node.  The area of the  sentinel nodes was  located within the axilla.  I made an elliptical incision  around the breast inclusive of the tumor and deepened this through skin and  subcutaneous tissue and raised a superior flap of the clavicle.  At the  axillary end of the clavicle, I used Neoprobe to locate the sentinel node;  this was dissected free an forwarded for pathologic evaluation.  The node  itself felt very hard and unusual; however, the pathology report returned no  definite evidence of metastatic carcinoma within the node.  Upon palpating  up within the axilla, there were several other hard-filling nodes; I  therefore decided to go ahead and do a modified radical mastectomy on this  patient.  Inferior flap was raised down to the rectus muscle and flaps were  carried laterally to the anterior border of the latissimus dorsi and  medially to the sternal border.  The breast tissue was then dissected free  from off the pectoralis major muscle, carrying the dissection laterally over  the edge of the pectoralis major and minor muscles. then the dissection was  carried up into the  axilla.  Starting at [facility] ligament and continued  the dissection along the axillary vein.  The entire axillary content was  taken down, removed and forwarded for pathologic evaluation.  The long  thoracic and thoracodorsal nerves were positively identified and spared in  this procedure.  The wound was then thoroughly irrigated with saline.  Sponge and instrument counts were verified.  A Jackson-Pratt and size 19-  Jamaica Blake drains were placed under the flaps for drainage.  Subcutaneous  tissues were reapproximated with interrupted 2-0 Vicryl sutures.  The skin  was closed with a stainless steel staples.  Sterile dressings were applied.  I placed approximately 30 mL of 0.5% Marcaine under the flap for additional  analgesia.  The patient was then removed from the operating room to the  recovery room in stable condition.  She tolerated the procedure  well.      PB/MEDQ  D:  12/29/2004  T:  12/30/2004  Job:  [id number]   cc:   Osvaldo Shipper. Spruill, M.D.  [address] [id number]  [place]  [place] [address]  Fax: [phone]   Marletta Lor. Roxan Hockey  Fax: [phone]

## 2011-02-17 NOTE — Discharge Summary (Signed)
[place]. [facility]  Patient:    Cynthia Copeland                     MRN: [id number] Adm. Date:  [id number] Disc. Date: [id number] Attending:  Roque Lias                           Discharge Summary  HISTORY OF PRESENT ILLNESS:  The patient is a 77 year old female who came to the office on the day of admission for a blood pressure recheck and was found to be in atrial fibrillation with a rapid ventricular response.  She had been hospitalized at this hospital for a GI bleed earlier in the week.  Endoscopy revealed a gastric ulcer.  She was transfused with packed red blood cells and discharged three days prior to admission.  During her hospital stay, she was monitored continuously by telemetry and was found to be in normal sinus rhythm throughout her hospital stay, up until the time of discharge.  She returned to the office on the morning of admission for a routine blood pressure check, since her blood pressures had been mildly elevated while in the hospital.  She stated that she awoke the morning of admission feeling tired, weak, fatigued, and dizzy.  She denied any sensation of rapid or irregular heartbeat, chest pain, pressure, tightness, or shortness of breath.  There had been no known history of previous episodes of atrial fibrillation or any other cardiac problems and she has had no obvious GI bleeding since her discharge.  On physical examination, her cardiac rhythm was rapid and irregular and her EKG confirmed the presence of atrial fibrillation with rapid ventricular response. She was admitted for further evaluation and rate control.  PHYSICAL EXAMINATION:  VITAL SIGNS:  Blood pressure 120/70, pulse 150 and irregular, respirations 20, temperature 97.5.  She appeared alert and in no distress.  SKIN:  Warm and dry.  HEENT:  PERRL.  Fundi benign.  ENT normal.  NECK:  No adenopathy, JVD, or bruit.  Thyroid normal.  Carotid pulses  were full.  LUNGS:  Clear.  CARDIAC:  Cardiac rhythm was irregularly irregular without gallops or murmurs.  ABDOMEN:  Negative.  She had no masses on rectal examination and her stool was Hemoccult negative.  EXTREMITIES:  She had no edema.  Her right pedal pulse could not be found.  NEUROLOGIC:  Within normal limits.  IMPRESSION: 1. Atrial fibrillation, new onset, with uncontrolled ventricular rate. 2. Duodenal ulcer with recent GI bleed. 3. Hypertension.  LABORATORY DATA:  An echocardiogram showed the left ventricle was small. There was moderate concentric left ventricular hypertrophy.  The ejection fraction was normal.  Her EKG on admission revealed atrial fibrillation with rapid ventricular response and minimal voltage criteria for LVH and nonspecific T wave abnormality.  On April 28, 2000, she was in sinus rhythm with a rate of 67 and a followup tracing on April 29, 2000 also revealed normal sinus rhythm with a rate of 84.  Chest x-ray was negative.  Her hemoglobin was 10.3, white count 6400, hemoglobin decreased to 9.4 during her hospital stay.  One Hemoccult was negative and the other was positive. Her pro time INRs were 0.9 and 1.1.  Her CMET revealed a glucose of 125, otherwise normal.  CPK, MB, and troponins were negative.  TSH was within normal range.  HOSPITAL COURSE:  The patient was hospitalized on the 2000 floor and monitored  by telemetry.  Vital signs were obtained every two hours initially for the first 12 hours, then every 4 hours.  She was allowed to be up ad lib, given a no added salt diet.  Sodium lock was inserted.  She was started on diltiazem 50 mg IV over two minutes, then a drip of 5 mg per hour.  She was seen in consultation by [facility].  She was initially treated with heparin and then begun on Coumadin.  She converted to a junctional rhythm on April 28, 2000 on the monitor.  On April 29, 2000, she had both junctional rhythm and sinus rhythm  and did have an episode of atrial fibrillation the previous day, April 28, 2000 at 0830.  She was switched to p.o. diltiazem.  She did have some heme-positive stool and her hemoglobin dropped to 9.4.  Her monitor showed normal sinus rhythm.  Her cardiologist felt that since she did convert to normal sinus rhythm within 72 hours that she did not need long-term anticoagulation and that her Coumadin could be stopped.  This was stopped and on May 01, 2000 and she had no complaints.  Her monitor showed a junctional rhythm and it was felt that she could be discharged.  Her lungs were clear. Her cardiac rhythm was regular without murmur and her abdomen was unremarkable.  FINAL DIAGNOSES: 1. Atrial fibrillation. 2. GI bleed due to gastric ulcer. 3. Hypertension.  DISCHARGE MEDICATIONS: 1. Diltiazem extended release 240 mg once a day. 2. Prilosec 20 mg a day. 3. Lanoxin 0.125 per day. 4. Carafate 1 g four times a day.  ACTIVITY:  As tolerated.  DIET:  Regular.  FOLLOW-UP:  Return to see me in a week. DD:  05/12/00 TD:  05/14/00 Job: [id number]

## 2011-06-28 LAB — POCT I-STAT, CHEM 8
BUN: 22
BUN: 23
Calcium, Ion: 1.09 — ABNORMAL LOW
Creatinine, Ser: 0.9
Creatinine, Ser: 1
Glucose, Bld: 100 — ABNORMAL HIGH
Glucose, Bld: 97
Sodium: 139
Sodium: 139
TCO2: 26
TCO2: 29

## 2011-07-03 LAB — CBC
HCT: 25.9 — ABNORMAL LOW
Hemoglobin: 7.2 — CL
Hemoglobin: 8.5 — ABNORMAL LOW
Hemoglobin: 8.6 — ABNORMAL LOW
Hemoglobin: 9.7 — ABNORMAL LOW
Hemoglobin: 9.9 — ABNORMAL LOW
MCHC: 32.4
MCHC: 32.7
MCHC: 33.3
MCHC: 33.5
MCHC: 33.8
MCHC: 33.8
MCHC: 34
MCV: 87.7
MCV: 88.3
Platelets: 104 — ABNORMAL LOW
Platelets: 99 — ABNORMAL LOW
RBC: 2.92 — ABNORMAL LOW
RBC: 2.96 — ABNORMAL LOW
RBC: 3.22 — ABNORMAL LOW
RBC: 3.3 — ABNORMAL LOW
RBC: 4.12
RDW: 14.5
RDW: 14.5
RDW: 14.6
RDW: 14.6
RDW: 14.7
WBC: 5.9
WBC: 6.6

## 2011-07-03 LAB — BASIC METABOLIC PANEL
BUN: 11
BUN: 24 — ABNORMAL HIGH
CO2: 24
CO2: 25
CO2: 25
CO2: 27
CO2: 28
Calcium: 7.8 — ABNORMAL LOW
Calcium: 7.9 — ABNORMAL LOW
Calcium: 8.1 — ABNORMAL LOW
Calcium: 8.2 — ABNORMAL LOW
Chloride: 100
Creatinine, Ser: 0.99
Creatinine, Ser: 1.08
GFR calc Af Amer: 41 — ABNORMAL LOW
GFR calc Af Amer: 49 — ABNORMAL LOW
GFR calc Af Amer: >60
GFR calc non Af Amer: 34 — ABNORMAL LOW
GFR calc non Af Amer: 40 — ABNORMAL LOW
GFR calc non Af Amer: 50 — ABNORMAL LOW
GFR calc non Af Amer: 55 — ABNORMAL LOW
Glucose, Bld: 112 — ABNORMAL HIGH
Glucose, Bld: 113 — ABNORMAL HIGH
Glucose, Bld: 126 — ABNORMAL HIGH
Glucose, Bld: 149 — ABNORMAL HIGH
Potassium: 5.2 — ABNORMAL HIGH
Sodium: 134 — ABNORMAL LOW
Sodium: 136
Sodium: 138

## 2011-07-03 LAB — URINALYSIS, ROUTINE W REFLEX MICROSCOPIC
Nitrite: NEGATIVE
Specific Gravity, Urine: 1.009
Urobilinogen, UA: 0.2

## 2011-07-03 LAB — COMPREHENSIVE METABOLIC PANEL
AST: 23
CO2: 29
Calcium: 8.9
Creatinine, Ser: 0.73
GFR calc Af Amer: >60
GFR calc non Af Amer: >60
Glucose, Bld: 96

## 2011-07-03 LAB — HEPARIN INDUCED THROMBOCYTOPENIA PNL
Heparin Induced Plt Ab: NEGATIVE
Patient O.D.: 0.248
Serotonin Release: 0 % release (ref <20)

## 2011-07-03 LAB — PROTIME-INR: INR: 1

## 2011-07-03 LAB — URINE CULTURE

## 2011-07-03 LAB — TYPE AND SCREEN: ABO/RH(D): O POS

## 2011-07-03 LAB — DIFFERENTIAL
Lymphocytes Relative: 38
Lymphs Abs: 1.6
Neutrophils Relative %: 50

## 2011-07-03 LAB — APTT: aPTT: 29

## 2011-07-03 LAB — ABO/RH: ABO/RH(D): O POS

## 2011-07-05 LAB — DIFFERENTIAL
Eosinophils Absolute: 0.1
Lymphs Abs: 1.1
Monocytes Absolute: 0.3
Monocytes Relative: 6
Neutrophils Relative %: 72

## 2011-07-05 LAB — POCT CARDIAC MARKERS
Myoglobin, poc: 62.8
Troponin i, poc: <0.05
Troponin i, poc: <0.05

## 2011-07-05 LAB — POCT I-STAT, CHEM 8
Calcium, Ion: 1.13
Creatinine, Ser: 1.2
Glucose, Bld: 97
Hemoglobin: 12.9
Potassium: 3.9

## 2011-07-05 LAB — CBC
HCT: 36.5
Hemoglobin: 11.9 — ABNORMAL LOW
MCHC: 32.5
MCV: 87.8
RBC: 4.16
WBC: 5.7

## 2011-08-28 ENCOUNTER — Other Ambulatory Visit: Payer: Self-pay | Admitting: Oncology

## 2011-08-28 DIAGNOSIS — Z1231 Encounter for screening mammogram for malignant neoplasm of breast: Secondary | ICD-10-CM

## 2011-09-29 ENCOUNTER — Ambulatory Visit
Admission: RE | Admit: 2011-09-29 | Discharge: 2011-09-29 | Disposition: A | Discharge status: Home / Self Care | Payer: Medicare Other | Source: Ambulatory Visit | Attending: Oncology | Admitting: Oncology

## 2011-09-29 DIAGNOSIS — Z1231 Encounter for screening mammogram for malignant neoplasm of breast: Secondary | ICD-10-CM

## 2012-05-14 ENCOUNTER — Encounter (HOSPITAL_COMMUNITY): Payer: Self-pay | Admitting: *Deleted

## 2012-05-14 ENCOUNTER — Inpatient Hospital Stay (HOSPITAL_COMMUNITY)
Admission: EM | Admit: 2012-05-14 | Discharge: 2012-05-17 | DRG: 291 | Disposition: A | Discharge status: Home / Self Care | Payer: Medicare Other | Attending: Internal Medicine | Admitting: Internal Medicine

## 2012-05-14 ENCOUNTER — Emergency Department (HOSPITAL_COMMUNITY): Payer: Medicare Other

## 2012-05-14 DIAGNOSIS — E7800 Pure hypercholesterolemia, unspecified: Secondary | ICD-10-CM | POA: Diagnosis present

## 2012-05-14 DIAGNOSIS — Z79899 Other long term (current) drug therapy: Secondary | ICD-10-CM

## 2012-05-14 DIAGNOSIS — Z853 Personal history of malignant neoplasm of breast: Secondary | ICD-10-CM

## 2012-05-14 DIAGNOSIS — I5021 Acute systolic (congestive) heart failure: Secondary | ICD-10-CM | POA: Diagnosis present

## 2012-05-14 DIAGNOSIS — D649 Anemia, unspecified: Secondary | ICD-10-CM | POA: Diagnosis present

## 2012-05-14 DIAGNOSIS — I509 Heart failure, unspecified: Secondary | ICD-10-CM | POA: Diagnosis present

## 2012-05-14 DIAGNOSIS — Z8719 Personal history of other diseases of the digestive system: Secondary | ICD-10-CM

## 2012-05-14 DIAGNOSIS — J9600 Acute respiratory failure, unspecified whether with hypoxia or hypercapnia: Secondary | ICD-10-CM | POA: Diagnosis present

## 2012-05-14 DIAGNOSIS — N390 Urinary tract infection, site not specified: Secondary | ICD-10-CM | POA: Diagnosis present

## 2012-05-14 DIAGNOSIS — R7989 Other specified abnormal findings of blood chemistry: Secondary | ICD-10-CM | POA: Diagnosis present

## 2012-05-14 DIAGNOSIS — Z880 Allergy status to penicillin: Secondary | ICD-10-CM

## 2012-05-14 DIAGNOSIS — E0590 Thyrotoxicosis, unspecified without thyrotoxic crisis or storm: Secondary | ICD-10-CM | POA: Diagnosis present

## 2012-05-14 DIAGNOSIS — I4891 Unspecified atrial fibrillation: Secondary | ICD-10-CM | POA: Diagnosis present

## 2012-05-14 DIAGNOSIS — I10 Essential (primary) hypertension: Secondary | ICD-10-CM | POA: Diagnosis present

## 2012-05-14 DIAGNOSIS — E785 Hyperlipidemia, unspecified: Secondary | ICD-10-CM | POA: Diagnosis present

## 2012-05-14 DIAGNOSIS — I428 Other cardiomyopathies: Secondary | ICD-10-CM | POA: Diagnosis present

## 2012-05-14 DIAGNOSIS — R0602 Shortness of breath: Secondary | ICD-10-CM | POA: Insufficient documentation

## 2012-05-14 HISTORY — DX: Unspecified osteoarthritis, unspecified site: M19.90

## 2012-05-14 HISTORY — DX: Essential (primary) hypertension: I10

## 2012-05-14 HISTORY — DX: Pure hypercholesterolemia, unspecified: E78.00

## 2012-05-14 LAB — CBC WITH DIFFERENTIAL/PLATELET
Eosinophils Relative: 3 % (ref 0–5)
HCT: 32.4 % — ABNORMAL LOW (ref 36.0–46.0)
Hemoglobin: 10.6 g/dL — ABNORMAL LOW (ref 12.0–15.0)
Lymphocytes Relative: 26 % (ref 12–46)
Lymphs Abs: 1 10*3/uL (ref 0.7–4.0)
MCV: 81.2 fL (ref 78.0–100.0)
Monocytes Absolute: 0.3 10*3/uL (ref 0.1–1.0)
Monocytes Relative: 8 % (ref 3–12)
Platelets: 137 10*3/uL — ABNORMAL LOW (ref 150–400)
RBC: 3.99 MIL/uL (ref 3.87–5.11)
WBC: 3.8 10*3/uL — ABNORMAL LOW (ref 4.0–10.5)

## 2012-05-14 LAB — BASIC METABOLIC PANEL
BUN: 17 mg/dL (ref 6–23)
CO2: 23 mEq/L (ref 19–32)
Calcium: 9.3 mg/dL (ref 8.4–10.5)
Glucose, Bld: 98 mg/dL (ref 70–99)
Sodium: 142 mEq/L (ref 135–145)

## 2012-05-14 LAB — URINE MICROSCOPIC-ADD ON

## 2012-05-14 LAB — URINALYSIS, ROUTINE W REFLEX MICROSCOPIC
Glucose, UA: NEGATIVE mg/dL
Protein, ur: 30 mg/dL — AB

## 2012-05-14 LAB — CARDIAC PANEL(CRET KIN+CKTOT+MB+TROPI)
CK, MB: 2.9 ng/mL (ref 0.3–4.0)
Relative Index: INVALID (ref 0.0–2.5)
Troponin I: <0.3 ng/mL (ref <0.30)

## 2012-05-14 LAB — TROPONIN I: Troponin I: <0.3 ng/mL (ref <0.30)

## 2012-05-14 LAB — PROTIME-INR
INR: 1.18 (ref 0.00–1.49)
Prothrombin Time: 15.3 seconds — ABNORMAL HIGH (ref 11.6–15.2)

## 2012-05-14 LAB — PRO B NATRIURETIC PEPTIDE: Pro B Natriuretic peptide (BNP): 3674 pg/mL — ABNORMAL HIGH (ref 0–450)

## 2012-05-14 MED ORDER — AMIODARONE LOAD VIA INFUSION
150.0000 mg | Freq: Once | INTRAVENOUS | Status: DC
  Filled 2012-05-14: qty 83.34

## 2012-05-14 MED ORDER — METOPROLOL TARTRATE 25 MG PO TABS
25.0000 mg | ORAL_TABLET | Freq: Two times a day (BID) | ORAL | Status: DC
  Administered 2012-05-14 – 2012-05-17 (×6): 25 mg via ORAL
  Filled 2012-05-14 (×7): qty 1

## 2012-05-14 MED ORDER — ENOXAPARIN SODIUM 60 MG/0.6ML ~~LOC~~ SOLN: 55.0000 mg | Freq: Two times a day (BID) | SUBCUTANEOUS | Status: DC

## 2012-05-14 MED ORDER — SODIUM CHLORIDE 0.9 % IV SOLN
INTRAVENOUS | Status: DC | PRN
  Administered 2012-05-14: 21:00:00 via INTRAVENOUS

## 2012-05-14 MED ORDER — ZOLPIDEM TARTRATE 5 MG PO TABS
5.0000 mg | ORAL_TABLET | Freq: Every evening | ORAL | Status: DC | PRN
  Administered 2012-05-14: 5 mg via ORAL
  Filled 2012-05-14: qty 1

## 2012-05-14 MED ORDER — PANTOPRAZOLE SODIUM 40 MG PO TBEC
40.0000 mg | DELAYED_RELEASE_TABLET | Freq: Every day | ORAL | Status: DC
  Administered 2012-05-15 – 2012-05-17 (×3): 40 mg via ORAL
  Filled 2012-05-14 (×3): qty 1

## 2012-05-14 MED ORDER — ONDANSETRON HCL 4 MG/2ML IJ SOLN
4.0000 mg | Freq: Four times a day (QID) | INTRAMUSCULAR | Status: DC | PRN
  Administered 2012-05-14: 4 mg via INTRAVENOUS
  Filled 2012-05-14: qty 2

## 2012-05-14 MED ORDER — ATORVASTATIN CALCIUM 20 MG PO TABS
20.0000 mg | ORAL_TABLET | Freq: Every day | ORAL | Status: DC
  Filled 2012-05-14: qty 1

## 2012-05-14 MED ORDER — AMIODARONE IV BOLUS ONLY 150 MG/100ML
150.0000 mg | Freq: Once | INTRAVENOUS | Status: AC
  Administered 2012-05-14: 150 mg via INTRAVENOUS
  Filled 2012-05-14: qty 100

## 2012-05-14 MED ORDER — ONDANSETRON HCL 4 MG PO TABS: 4.0000 mg | ORAL_TABLET | Freq: Four times a day (QID) | ORAL | Status: DC | PRN

## 2012-05-14 MED ORDER — AMIODARONE LOAD VIA INFUSION
150.0000 mg | Freq: Once | INTRAVENOUS | Status: DC
  Filled 2012-05-14 (×2): qty 83.34

## 2012-05-14 MED ORDER — DEXTROSE 5 % IV SOLN
5.0000 mg/h | Freq: Once | INTRAVENOUS | Status: AC
  Administered 2012-05-14: 5 mg/h via INTRAVENOUS
  Filled 2012-05-14: qty 100

## 2012-05-14 MED ORDER — METOPROLOL TARTRATE 1 MG/ML IV SOLN: 2.5000 mg | INTRAVENOUS | Status: DC | PRN

## 2012-05-14 MED ORDER — DEXTROSE 5 % IV SOLN
1.0000 g | Freq: Once | INTRAVENOUS | Status: AC
  Administered 2012-05-14: 1 g via INTRAVENOUS
  Filled 2012-05-14: qty 10

## 2012-05-14 MED ORDER — FUROSEMIDE 10 MG/ML IJ SOLN
20.0000 mg | Freq: Once | INTRAMUSCULAR | Status: AC
  Administered 2012-05-14: 20 mg via INTRAVENOUS

## 2012-05-14 MED ORDER — ATORVASTATIN CALCIUM 20 MG PO TABS
20.0000 mg | ORAL_TABLET | Freq: Every day | ORAL | Status: DC
  Administered 2012-05-16: 20 mg via ORAL
  Filled 2012-05-14 (×4): qty 1

## 2012-05-14 MED ORDER — AMIODARONE HCL IN DEXTROSE 360-4.14 MG/200ML-% IV SOLN
30.0000 mg/h | INTRAVENOUS | Status: DC
  Administered 2012-05-15: 30 mg/h via INTRAVENOUS
  Filled 2012-05-14 (×4): qty 200

## 2012-05-14 MED ORDER — DILTIAZEM HCL 25 MG/5ML IV SOLN
15.0000 mg | Freq: Once | INTRAVENOUS | Status: AC
  Administered 2012-05-14: 15 mg via INTRAVENOUS

## 2012-05-14 MED ORDER — SODIUM CHLORIDE 0.9 % IJ SOLN
3.0000 mL | Freq: Two times a day (BID) | INTRAMUSCULAR | Status: DC
  Administered 2012-05-15 – 2012-05-16 (×2): 3 mL via INTRAVENOUS

## 2012-05-14 MED ORDER — AMIODARONE HCL IN DEXTROSE 360-4.14 MG/200ML-% IV SOLN
60.0000 mg/h | INTRAVENOUS | Status: AC
  Administered 2012-05-14: 60 mg/h via INTRAVENOUS
  Filled 2012-05-14: qty 200

## 2012-05-14 MED ORDER — METOPROLOL TARTRATE 1 MG/ML IV SOLN
5.0000 mg | Freq: Once | INTRAVENOUS | Status: AC
  Administered 2012-05-14: 5 mg via INTRAVENOUS

## 2012-05-14 MED ORDER — FUROSEMIDE 10 MG/ML IJ SOLN
INTRAMUSCULAR | Status: AC
  Filled 2012-05-14: qty 4

## 2012-05-14 MED ORDER — DILTIAZEM HCL 25 MG/5ML IV SOLN
10.0000 mg | Freq: Once | INTRAVENOUS | Status: AC
  Administered 2012-05-14: 10 mg via INTRAVENOUS
  Filled 2012-05-14: qty 5

## 2012-05-14 MED ORDER — METOPROLOL TARTRATE 1 MG/ML IV SOLN
INTRAVENOUS | Status: AC
  Filled 2012-05-14: qty 5

## 2012-05-14 MED ORDER — ENOXAPARIN SODIUM 60 MG/0.6ML ~~LOC~~ SOLN
55.0000 mg | Freq: Two times a day (BID) | SUBCUTANEOUS | Status: DC
  Administered 2012-05-14: 55 mg via SUBCUTANEOUS
  Filled 2012-05-14 (×2): qty 0.6

## 2012-05-14 MED ORDER — AMIODARONE IV BOLUS ONLY 150 MG/100ML
150.0000 mg | Freq: Once | INTRAVENOUS | Status: DC
  Filled 2012-05-14: qty 100

## 2012-05-14 MED ORDER — ASPIRIN 81 MG PO CHEW
162.0000 mg | CHEWABLE_TABLET | Freq: Once | ORAL | Status: AC
  Administered 2012-05-14: 162 mg via ORAL
  Filled 2012-05-14: qty 2

## 2012-05-14 NOTE — H&P (Signed)
[facility] History and Physical  Cynthia Copeland:[id number] DOB: [birth year] DOA: 05/14/2012  Referring physician: ER PCP: Dr Sharyn Lull   Chief Complaint: SOB  HPI:  Patient says she started feeling SOB this morning.  Patient called EMS who found her in a fib with RVR.  In ER she was given IV metoprolol but did not respond.  She was then started on IV cardizem.  She is still having SOB. No CP, no n/v.   Also found to have an UTI- given rocephin.  Upon reviewing history she had 3 days of a fib with RVR in 2001.  She converted back to sinus and was not given anti-coagualtion then secondary to GI bleed.    I spoke with Dr. Sharyn Lull who will see the patient later today.  He recommended starting amiodarone and anticoagulation.     Review of Systems:  All systems reviewed, negative unless stated above  Past Medical History  Diagnosis Date  . Hypertension   . Hypercholesteremia   . Arthritis    Past Surgical History  Procedure Date  . Appendectomy   . Tubal ligation    Social History:  does not have a smoking history on file. She does not have any smokeless tobacco history on file. Her alcohol and drug histories not on file. From home  Allergies  Allergen Reactions  . Penicillins Hives    Family history: +DVT   Prior to Admission medications   Medication Sig Start Date End Date Taking? Authorizing Provider  amLODipine (NORVASC) 5 MG tablet Take 5 mg by mouth daily.   Yes Historical Provider, MD  cloNIDine (CATAPRES) 0.1 MG tablet Take 0.1 mg by mouth 2 (two) times daily.   Yes Historical Provider, MD  [facility] Liver Oil CAPS Take 2 capsules by mouth daily.   Yes Historical Provider, MD  CRANBERRY PO Take 1 tablet by mouth daily.    Yes Historical Provider, MD  Cyanocobalamin (VITAMIN B 12 PO) Take 1 tablet by mouth daily.   Yes Historical Provider, MD  Multiple Vitamin (MULTIVITAMIN WITH MINERALS) TABS Take 1 tablet by mouth daily.   Yes Historical Provider, MD  Omega-3  Fatty Acids (OMEGA 3 PO) Take 1 capsule by mouth daily.    Yes Historical Provider, MD  rosuvastatin (CRESTOR) 10 MG tablet Take 10 mg by mouth daily.   Yes Historical Provider, MD   Physical Exam: Filed Vitals:   05/14/12 1000 05/14/12 1115 05/14/12 1215 05/14/12 1245  BP: 138/107 139/103 148/106 158/103  Pulse: 136 146 135 152  Temp: 98.1 F (36.7 C)     Resp: 16 22 35 35  Height:  5\' 1"  (1.549 m)    Weight:  54.432 kg (120 lb)    SpO2: 100% 100% 93% 97%     General:  Uncomfortable appearing, oriented to person, place, and time  Eyes: WNL  ENT: WNL  Neck: supple  Cardiovascular: irregular and fast  Respiratory: no wheezing, clear anterior  Abdomen: +BS, soft, NT/ND  Skin: no rashes or lesions  Musculoskeletal: moves all 4 extremities  Psychiatric: no SI/no HI  Neurologic: no focal deficits  Labs on Admission:  Basic Metabolic Panel:  Lab 05/14/12 8119  NA 142  K 3.7  CL 109  CO2 23  GLUCOSE 98  BUN 17  CREATININE 0.53  CALCIUM 9.3  MG --  PHOS --   Liver Function Tests: No results found for this basename: AST:5,ALT:5,ALKPHOS:5,BILITOT:5,PROT:5,ALBUMIN:5 in the last 168 hours No results found for this basename: LIPASE:5,AMYLASE:5  in the last 168 hours No results found for this basename: AMMONIA:5 in the last 168 hours CBC:  Lab 05/14/12 1030  WBC 3.8*  NEUTROABS 2.4  HGB 10.6*  HCT 32.4*  MCV 81.2  PLT 137*   Cardiac Enzymes:  Lab 05/14/12 1030  CKTOTAL --  CKMB --  CKMBINDEX --  TROPONINI <0.30    BNP (last 3 results)  Basename 05/14/12 1030  PROBNP 3674.0*   CBG: No results found for this basename: GLUCAP:5 in the last 168 hours  Radiological Exams on Admission: Dg Chest Port 1 View  05/14/2012  *RADIOLOGY REPORT*  Clinical Data: short of breath  PORTABLE CHEST - 1 VIEW  Comparison: 06/06/08  Findings: Cardiac silhouette is mildly enlarged.  No mediastinal or hilar masses or adenopathy.  Lungs show prominent bronchovascular  markings, but otherwise clear.  Status post right mastectomy.  Bony thorax is intact.  IMPRESSION: No acute cardiopulmonary disease.  Original Report Authenticated By: Domenic Moras, M.D.    EKG: Independently reviewed. Atrial fib with RVR  Assessment/Plan Principal Problem:  *Atrial fibrillation with RVR Active Problems:  SOB (shortness of breath)  HTN (hypertension)  HLD (hyperlipidemia)  UTI (lower urinary tract infection)  History of GI bleed   1. A fib with RVR- lovenox, wean off cardizem and start amiodarone IV 2. UTI- rocephin 3. SOB- monitor closely for fluid overload 4. HTN- rate controlling medications 5. H/o GI bleed- monitor closely as patient to be started on lovenox 6. HLD- continue statin 7. Anemia- Fe panel, monitor  Code Status: full Family Communication: patient at bedside Disposition Plan: home when better  Time spent: 75 min  [facility], JESSICA [facility] Pager [phone]   If 7PM-7AM, please contact night-coverage [email or web address] Password [facility] 05/14/2012, 2:36 PM

## 2012-05-14 NOTE — ED Provider Notes (Addendum)
History     CSN: [id number]  Arrival date & time 05/14/12  4098   First MD Initiated Contact with Patient 05/14/12 1000      Chief Complaint  Patient presents with  . Atrial Fibrillation  . Shortness of Breath    (Consider location/radiation/quality/duration/timing/severity/associated sxs/prior treatment) HPI Comments: 78 y/o female with hx of HTN comes in with cc of SOB. Pt reports that this am she started feeling sob, and had to call EMS. EMS arrived, and noted that patient was diaphoretic, and in respiratory distress with a-fib with rvr at a rate of > 140. Pt was given 5 metoprolol ivp by EMS. Pt arrives to the ED still in afib with RVR. Pt has no n/v/f/c/chest pain. She is no longer SOB. She denies any recent infection. There is no hx of CHF. Pt has no hx of PE, DVT, and she has no risk factors for the same. Pt did get mastectomy years back, as she had cancer.  Patient is a 78 y.o. female presenting with atrial fibrillation and shortness of breath. The history is provided by the patient.  Atrial Fibrillation Associated symptoms include shortness of breath. Pertinent negatives include no chest pain and no abdominal pain.  Shortness of Breath  Associated symptoms include shortness of breath. Pertinent negatives include no chest pain, no cough and no wheezing.    Past Medical History  Diagnosis Date  . Hypertension   . Hypercholesteremia     No past surgical history on file.  No family history on file.  History  Substance Use Topics  . Smoking status: Not on file  . Smokeless tobacco: Not on file  . Alcohol Use:     OB History    Grav Para Term Preterm Abortions TAB SAB Ect Mult Living                  Review of Systems  Constitutional: Negative for activity change.  HENT: Negative for facial swelling, neck pain and neck stiffness.   Respiratory: Positive for shortness of breath. Negative for cough, chest tightness and wheezing.   Cardiovascular: Negative for  chest pain.  Gastrointestinal: Negative for nausea, vomiting, abdominal pain, diarrhea, constipation, blood in stool and abdominal distention.  Genitourinary: Negative for hematuria and difficulty urinating.  Skin: Negative for color change.  Neurological: Negative for speech difficulty.  Hematological: Does not bruise/bleed easily.  Psychiatric/Behavioral: Negative for confusion.    Allergies  Penicillins  Home Medications   Current Outpatient Rx  Name Route Sig Dispense Refill  . AMLODIPINE BESYLATE 5 MG PO TABS Oral Take 5 mg by mouth daily.    Marland Kitchen CLONIDINE HCL 0.1 MG PO TABS Oral Take 0.1 mg by mouth 2 (two) times daily.    . COD LIVER OIL PO CAPS Oral Take 2 capsules by mouth daily.    Marland Kitchen CRANBERRY PO Oral Take 1 tablet by mouth daily.     Marland Kitchen VITAMIN B 12 PO Oral Take 1 tablet by mouth daily.    . ADULT MULTIVITAMIN W/MINERALS CH Oral Take 1 tablet by mouth daily.    . OMEGA 3 PO Oral Take 1 capsule by mouth daily.     Marland Kitchen ROSUVASTATIN CALCIUM 10 MG PO TABS Oral Take 10 mg by mouth daily.      BP 139/103  Pulse 146  Temp 98.1 F (36.7 C)  Resp 22  Ht 5\' 1"  (1.549 m)  Wt 120 lb (54.432 kg)  BMI 22.67 kg/m2  SpO2 100%  Physical  Exam  Constitutional: She is oriented to person, place, and time. She appears well-developed.  HENT:  Head: Normocephalic and atraumatic.  Eyes: Conjunctivae and EOM are normal. Pupils are equal, round, and reactive to light.  Neck: Normal range of motion. Neck supple. No JVD present.  Cardiovascular: Normal heart sounds.   No murmur heard.      Irregularly irregular - tachycardia  Pulmonary/Chest: Effort normal and breath sounds normal. No respiratory distress.  Abdominal: Soft. Bowel sounds are normal. She exhibits no distension. There is no tenderness. There is no rebound and no guarding.  Neurological: She is alert and oriented to person, place, and time.  Skin: Skin is warm and dry.    ED Course  Procedures (including critical care  time)  Labs Reviewed  CBC WITH DIFFERENTIAL - Abnormal; Notable for the following:    WBC 3.8 (*)     Hemoglobin 10.6 (*)     HCT 32.4 (*)     Platelets 137 (*)     All other components within normal limits  BASIC METABOLIC PANEL - Abnormal; Notable for the following:    GFR calc non Af Amer 89 (*)     All other components within normal limits  PRO B NATRIURETIC PEPTIDE - Abnormal; Notable for the following:    Pro B Natriuretic peptide (BNP) 3674.0 (*)     All other components within normal limits  PROTIME-INR - Abnormal; Notable for the following:    Prothrombin Time 15.3 (*)     All other components within normal limits  TROPONIN I  APTT  URINALYSIS, ROUTINE W REFLEX MICROSCOPIC   Dg Chest Port 1 View  05/14/2012  *RADIOLOGY REPORT*  Clinical Data: short of breath  PORTABLE CHEST - 1 VIEW  Comparison: 06/06/08  Findings: Cardiac silhouette is mildly enlarged.  No mediastinal or hilar masses or adenopathy.  Lungs show prominent bronchovascular markings, but otherwise clear.  Status post right mastectomy.  Bony thorax is intact.  IMPRESSION: No acute cardiopulmonary disease.  Original Report Authenticated By: Domenic Moras, M.D.     No diagnosis found.    MDM   Date: 05/14/2012  Rate: 149  Rhythm: atrial fibrillation  QRS Axis: normal  Intervals: normal  ST/T Wave abnormalities: nonspecific ST/T changes  Conduction Disutrbances:none  Narrative Interpretation:   Old EKG Reviewed: changes noted  Differential diagnosis includes: ACS syndrome CHF exacerbation Valvular disorder Myocarditis Pericarditis Pericardial effusion Pneumonia Pleural effusion Pulmonary edema PE Anemia Musculoskeletal pain Underlying infection  A/P: Pt comes in with cc of SOB and noted to be afib with rvr. Pt is s/p 5 mg iv metoprolol - no response to the HR, although SOB has improved. Will try to control the rate with Cardizem. Will try to get underlying cause diagnosed. No evidence  of CHF on exam. CHADS 2 SCORE IS 2. New onset a-fib, will require admission.   Derwood Kaplan, MD 05/14/12 1223  1:59 PM Called Cardiologist Dr. Sharyn Lull - no response yet. Pt is still tachycardic. Stable though. Hospitalist and I agree, we dont mind awaiting Cardiology to take over the rate control.  Derwood Kaplan, MD 05/14/12 1400

## 2012-05-14 NOTE — Progress Notes (Signed)
ANTICOAGULATION CONSULT NOTE - Initial Consult  Pharmacy Consult for Lovenox Indication: New onset Afib  Allergies  Allergen Reactions  . Penicillins Hives    Patient Measurements: Height: 5\' 1"  (154.9 cm) Weight: 120 lb (54.432 kg) IBW/kg (Calculated) : 47.8   Vital Signs: Temp: 98.1 F (36.7 C) (08/13 1000) BP: 139/103 mmHg (08/13 1115) Pulse Rate: 146  (08/13 1115)  Labs:  Basename 05/14/12 1030  HGB 10.6*  HCT 32.4*  PLT 137*  APTT 32  LABPROT 15.3*  INR 1.18  HEPARINUNFRC --  CREATININE 0.53  CKTOTAL --  CKMB --  TROPONINI <0.30    Estimated Creatinine Clearance: 43.7 ml/min (by C-G formula based on Cr of 0.53).   Medical History: Past Medical History  Diagnosis Date  . Hypertension   . Hypercholesteremia     Medications:  PTA: Norvasc, clonidine, vitamin B-12, fish oil, crestor  Assessment: 78 y.o. F who presented to the [facility] on 8/13 with new-onset Afib. Wt: 54.4 kg, SCr 0.53, CrCl~35-40 ml/min. Hgb/Hct: 10.6/32.4, plts 137. The patient's states she has had a knee replacement surgery in the past year -- no other surgeries or recent history of bleeding noted.  Goal of Therapy:  Anti-Xa level 0.6-1.2 units/ml 4hrs after LMWH dose given Monitor platelets by anticoagulation protocol: Yes   Plan:  1. Lovenox 55 mg SQ every 12 hours 2. Will continue to monitor for any signs/symptoms of bleeding, changes in renal function for dose adjustments, and oral anticoagulation plans  Georgina Pillion, PharmD, BCPS Clinical Pharmacist Pager: [phone] 05/14/2012 11:53 AM

## 2012-05-14 NOTE — Consult Note (Signed)
Reason for Consult: Atrial fibrillation with rapid response Referring Physician: [facility]  Cynthia Copeland is an 78 y.o. female.  HPI: Patient is 78 year old female with past medical history significant for hypertension hypercholesteremia degenerative joint disease history of remote paroxysmal atrial fibrillation, history of CVA of breast, came to the ER complaining of sudden onset of palpitation associated with shortness of breath earlier this morning and was noted to be in A. fib with rapid ventricular response. The patient received IV Cardizem and was started on Cardizem bolus without much improvement in heart rate. Patient denies any chest pain nausea vomiting diaphoresis. Denies history of PND orthopnea but complains of leg swelling. Patient also complains of feeling weak tired fatigued. Patient denies such episodes in the recent past. Denies any cough fever or chills. Denies history of recent weight loss or weight gain. Denies any thyroid problems in the past. Denies history of rheumatic fever in the past.  Past Medical History  Diagnosis Date  . Hypertension   . Hypercholesteremia   . Arthritis     Past Surgical History  Procedure Date  . Appendectomy   . Tubal ligation     No family history on file.  Social History:  does not have a smoking history on file. She does not have any smokeless tobacco history on file. Her alcohol and drug histories not on file.  Allergies:  Allergies  Allergen Reactions  . Penicillins Hives    Medications: I have reviewed the patient's current medications.  Results for orders placed during the hospital encounter of 05/14/12 (from the past 48 hour(s))  CBC WITH DIFFERENTIAL     Status: Abnormal   Collection Time   05/14/12 10:30 AM      Component Value Range Comment   WBC 3.8 (*) 4.0 - 10.5 K/uL    RBC 3.99  3.87 - 5.11 MIL/uL    Hemoglobin 10.6 (*) 12.0 - 15.0 g/dL    HCT 78.2 (*) 95.6 - 46.0 %    MCV 81.2  78.0 - 100.0 fL    MCH  26.6  26.0 - 34.0 pg    MCHC 32.7  30.0 - 36.0 g/dL    RDW 21.3  08.6 - 57.8 %    Platelets 137 (*) 150 - 400 K/uL    Neutrophils Relative 62  43 - 77 %    Neutro Abs 2.4  1.7 - 7.7 K/uL    Lymphocytes Relative 26  12 - 46 %    Lymphs Abs 1.0  0.7 - 4.0 K/uL    Monocytes Relative 8  3 - 12 %    Monocytes Absolute 0.3  0.1 - 1.0 K/uL    Eosinophils Relative 3  0 - 5 %    Eosinophils Absolute 0.1  0.0 - 0.7 K/uL    Basophils Relative 1  0 - 1 %    Basophils Absolute 0.0  0.0 - 0.1 K/uL   BASIC METABOLIC PANEL     Status: Abnormal   Collection Time   05/14/12 10:30 AM      Component Value Range Comment   Sodium 142  135 - 145 mEq/L    Potassium 3.7  3.5 - 5.1 mEq/L    Chloride 109  96 - 112 mEq/L    CO2 23  19 - 32 mEq/L    Glucose, Bld 98  70 - 99 mg/dL    BUN 17  6 - 23 mg/dL    Creatinine, Ser 4.69  0.50 - 1.10  mg/dL    Calcium 9.3  8.4 - 16.1 mg/dL    GFR calc non Af Amer 89 (*) >90 mL/min    GFR calc Af Amer >90  >90 mL/min   TROPONIN I     Status: Normal   Collection Time   05/14/12 10:30 AM      Component Value Range Comment   Troponin I <0.30  <0.30 ng/mL   PRO B NATRIURETIC PEPTIDE     Status: Abnormal   Collection Time   05/14/12 10:30 AM      Component Value Range Comment   Pro B Natriuretic peptide (BNP) 3674.0 (*) 0 - 450 pg/mL   APTT     Status: Normal   Collection Time   05/14/12 10:30 AM      Component Value Range Comment   aPTT 32  24 - 37 seconds   PROTIME-INR     Status: Abnormal   Collection Time   05/14/12 10:30 AM      Component Value Range Comment   Prothrombin Time 15.3 (*) 11.6 - 15.2 seconds    INR 1.18  0.00 - 1.49   URINALYSIS, ROUTINE W REFLEX MICROSCOPIC     Status: Abnormal   Collection Time   05/14/12 12:04 PM      Component Value Range Comment   Color, Urine YELLOW  YELLOW    APPearance CLOUDY (*) CLEAR    Specific Gravity, Urine 1.018  1.005 - 1.030    pH 5.0  5.0 - 8.0    Glucose, UA NEGATIVE  NEGATIVE mg/dL    Hgb urine dipstick  SMALL (*) NEGATIVE    Bilirubin Urine NEGATIVE  NEGATIVE    Ketones, ur NEGATIVE  NEGATIVE mg/dL    Protein, ur 30 (*) NEGATIVE mg/dL    Urobilinogen, UA 0.2  0.0 - 1.0 mg/dL    Nitrite NEGATIVE  NEGATIVE    Leukocytes, UA MODERATE (*) NEGATIVE   URINE MICROSCOPIC-ADD ON     Status: Abnormal   Collection Time   05/14/12 12:04 PM      Component Value Range Comment   Squamous Epithelial / LPF FEW (*) RARE    WBC, UA 11-20  <3 WBC/hpf    RBC / HPF 3-6  <3 RBC/hpf    Bacteria, UA FEW (*) RARE    Urine-Other MUCOUS PRESENT   AMORPHOUS URATES/PHOSPHATES    Dg Chest Port 1 View  05/14/2012  *RADIOLOGY REPORT*  Clinical Data: short of breath  PORTABLE CHEST - 1 VIEW  Comparison: 06/06/08  Findings: Cardiac silhouette is mildly enlarged.  No mediastinal or hilar masses or adenopathy.  Lungs show prominent bronchovascular markings, but otherwise clear.  Status post right mastectomy.  Bony thorax is intact.  IMPRESSION: No acute cardiopulmonary disease.  Original Report Authenticated By: Domenic Moras, M.D.    Review of Systems  Constitutional: Negative for fever, chills and weight loss.  HENT: Negative for hearing loss.   Eyes: Negative for blurred vision and double vision.  Respiratory: Negative for cough, hemoptysis, sputum production and shortness of breath.   Cardiovascular: Positive for palpitations and leg swelling. Negative for chest pain.  Gastrointestinal: Negative for nausea, vomiting and abdominal pain.  Genitourinary: Negative for dysuria and urgency.  Neurological: Positive for dizziness and weakness. Negative for headaches.   Blood pressure 158/103, pulse 152, temperature 98.1 F (36.7 C), resp. rate 35, height 5\' 1"  (1.549 m), weight 54.432 kg (120 lb), SpO2 97.00%. Physical Exam  Constitutional: She is oriented to  person, place, and time. She appears well-developed.  HENT:  Head: Normocephalic and atraumatic.  Nose: Nose normal.  Eyes: Conjunctivae are normal. Left eye  exhibits no discharge. No scleral icterus.  Neck: Normal range of motion. Neck supple. No JVD present. No tracheal deviation present. No thyromegaly present.  Cardiovascular:       Irregularly irregular tachycardic S1-S2 soft there is soft systolic murmur and S3 gallop  Respiratory:       Decreased breath sound at bases  GI: Soft. Bowel sounds are normal. She exhibits no distension. There is no tenderness. There is no rebound.  Musculoskeletal:       No clubbing cyanosis 2+ edema left more than right  Neurological: She is alert and oriented to person, place, and time.    Assessment/Plan: A. fib with rapid ventricular response rule out coronary insufficiency Mild congestive heart failure secondary to tachycardia induced Uncontrolled hypertension Hypercholesteremia Degenerative joint disease History of Ca of breast History of remote paroxysmal A. fib Plan Agree with the present management Add IV amiodarone bolus plus drip as per orders Check serial enzymes EKG and thyroid panel lipid panel Check 2-D echo  Robynn Pane 05/14/2012, 4:59 PM

## 2012-05-14 NOTE — ED Notes (Signed)
Per EMS pt from home with c/o shortness of breath. Pt found to be in A-Fib with RVR- rate between 160-180. Given Lopressor 5mg  IVP- rate down to 120-140.  Denies chest pain. C/o diaphoresis. BP 165/132. IV 18G LAC.

## 2012-05-15 ENCOUNTER — Inpatient Hospital Stay (HOSPITAL_COMMUNITY): Payer: Medicare Other

## 2012-05-15 DIAGNOSIS — J9600 Acute respiratory failure, unspecified whether with hypoxia or hypercapnia: Secondary | ICD-10-CM

## 2012-05-15 DIAGNOSIS — I5021 Acute systolic (congestive) heart failure: Secondary | ICD-10-CM | POA: Diagnosis present

## 2012-05-15 DIAGNOSIS — I509 Heart failure, unspecified: Secondary | ICD-10-CM

## 2012-05-15 DIAGNOSIS — D649 Anemia, unspecified: Secondary | ICD-10-CM | POA: Diagnosis present

## 2012-05-15 LAB — CARDIAC PANEL(CRET KIN+CKTOT+MB+TROPI)
CK, MB: 3.3 ng/mL (ref 0.3–4.0)
Relative Index: INVALID (ref 0.0–2.5)
Total CK: 67 U/L (ref 7–177)
Troponin I: <0.3 ng/mL (ref <0.30)

## 2012-05-15 LAB — LIPID PANEL
Cholesterol: 124 mg/dL (ref 0–200)
HDL: 70 mg/dL (ref >39)
Total CHOL/HDL Ratio: 1.8 RATIO
Triglycerides: 68 mg/dL (ref <150)
VLDL: 14 mg/dL (ref 0–40)

## 2012-05-15 LAB — CBC
HCT: 34.1 % — ABNORMAL LOW (ref 36.0–46.0)
Hemoglobin: 11 g/dL — ABNORMAL LOW (ref 12.0–15.0)
MCH: 26.3 pg (ref 26.0–34.0)
MCV: 81.6 fL (ref 78.0–100.0)
Platelets: UNDETERMINED 10*3/uL (ref 150–400)
RBC: 4.18 MIL/uL (ref 3.87–5.11)

## 2012-05-15 LAB — COMPREHENSIVE METABOLIC PANEL
Albumin: 3.3 g/dL — ABNORMAL LOW (ref 3.5–5.2)
BUN: 15 mg/dL (ref 6–23)
Calcium: 9.6 mg/dL (ref 8.4–10.5)
GFR calc Af Amer: >90 mL/min (ref >90)
Glucose, Bld: 145 mg/dL — ABNORMAL HIGH (ref 70–99)
Sodium: 141 mEq/L (ref 135–145)
Total Protein: 7.1 g/dL (ref 6.0–8.3)

## 2012-05-15 LAB — URINE CULTURE: Colony Count: 15000

## 2012-05-15 LAB — TSH: TSH: <0.008 u[IU]/mL — ABNORMAL LOW (ref 0.350–4.500)

## 2012-05-15 LAB — MRSA PCR SCREENING: MRSA by PCR: NEGATIVE

## 2012-05-15 LAB — IRON AND TIBC: TIBC: 317 ug/dL (ref 250–470)

## 2012-05-15 MED ORDER — APIXABAN 5 MG PO TABS
5.0000 mg | ORAL_TABLET | Freq: Two times a day (BID) | ORAL | Status: DC
  Administered 2012-05-15 – 2012-05-17 (×5): 5 mg via ORAL
  Filled 2012-05-15 (×7): qty 1

## 2012-05-15 MED ORDER — FUROSEMIDE 10 MG/ML IJ SOLN
40.0000 mg | Freq: Once | INTRAMUSCULAR | Status: AC
  Administered 2012-05-15: 40 mg via INTRAVENOUS
  Filled 2012-05-15: qty 4

## 2012-05-15 MED ORDER — POTASSIUM CHLORIDE CRYS ER 20 MEQ PO TBCR
40.0000 meq | EXTENDED_RELEASE_TABLET | Freq: Once | ORAL | Status: AC
  Administered 2012-05-15: 40 meq via ORAL
  Filled 2012-05-15: qty 2

## 2012-05-15 MED ORDER — CIPROFLOXACIN HCL 250 MG PO TABS
250.0000 mg | ORAL_TABLET | Freq: Two times a day (BID) | ORAL | Status: DC
  Administered 2012-05-15 – 2012-05-17 (×5): 250 mg via ORAL
  Filled 2012-05-15 (×7): qty 1

## 2012-05-15 MED ORDER — POTASSIUM CHLORIDE CRYS ER 10 MEQ PO TBCR
10.0000 meq | EXTENDED_RELEASE_TABLET | Freq: Every day | ORAL | Status: DC
  Administered 2012-05-15 – 2012-05-17 (×3): 10 meq via ORAL
  Filled 2012-05-15 (×3): qty 1

## 2012-05-15 MED ORDER — AMLODIPINE BESYLATE 5 MG PO TABS
5.0000 mg | ORAL_TABLET | Freq: Every day | ORAL | Status: DC
  Administered 2012-05-15 – 2012-05-17 (×4): 5 mg via ORAL
  Filled 2012-05-15 (×4): qty 1

## 2012-05-15 MED ORDER — AMIODARONE HCL 200 MG PO TABS
200.0000 mg | ORAL_TABLET | Freq: Two times a day (BID) | ORAL | Status: DC
  Administered 2012-05-15 – 2012-05-17 (×5): 200 mg via ORAL
  Filled 2012-05-15 (×6): qty 1

## 2012-05-15 MED ORDER — BIOTENE DRY MOUTH MT LIQD: 15.0000 mL | Freq: Two times a day (BID) | OROMUCOSAL | Status: DC

## 2012-05-15 MED ORDER — RAMIPRIL 5 MG PO CAPS
5.0000 mg | ORAL_CAPSULE | Freq: Every day | ORAL | Status: DC
  Administered 2012-05-15 – 2012-05-17 (×3): 5 mg via ORAL
  Filled 2012-05-15 (×3): qty 1

## 2012-05-15 MED ORDER — FUROSEMIDE 40 MG PO TABS
40.0000 mg | ORAL_TABLET | Freq: Every day | ORAL | Status: DC
  Administered 2012-05-15 – 2012-05-17 (×3): 40 mg via ORAL
  Filled 2012-05-15 (×2): qty 1

## 2012-05-15 MED ORDER — CLONIDINE HCL 0.1 MG PO TABS
0.1000 mg | ORAL_TABLET | Freq: Every day | ORAL | Status: DC
  Administered 2012-05-15 – 2012-05-17 (×3): 0.1 mg via ORAL
  Filled 2012-05-15 (×4): qty 1

## 2012-05-15 MED ORDER — LEVALBUTEROL HCL 1.25 MG/0.5ML IN NEBU
1.2500 mg | INHALATION_SOLUTION | Freq: Four times a day (QID) | RESPIRATORY_TRACT | Status: DC | PRN
  Filled 2012-05-15: qty 0.5

## 2012-05-15 MED ORDER — BIOTENE DRY MOUTH MT LIQD
15.0000 mL | Freq: Two times a day (BID) | OROMUCOSAL | Status: DC
  Administered 2012-05-15: 15 mL via OROMUCOSAL

## 2012-05-15 NOTE — Progress Notes (Signed)
[facility] PROGRESS NOTE  Cynthia Copeland ZOX:[id number] DOB: 1931-11-27 DOA: 05/14/2012 PCP: Robynn Pane, MD  Assessment/Plan: 1. Acute respiratory failure--resolved. Secondary to afib RVR, acute CHF. 2. Atrial fibrillation with RVR--Resolved, now in SR. Management per cardiology--metoprolol, changing to oral amiodarone and adding Eliquis. EKG this am--SR, PVC, prolonged QT 3. Acute systolic CHF--Clinically improved. Continue Lasix, metoprolol. Add ACE-I. Management, further evaluation per cardiology. 4. UTI--follow-up culture. Empiric Cipro. 5. Elevated LFTS--check in AM.  6. Normocytic anemia--stable. Appears at baseline. Follow-up as an outpatient. 7. HTN--stable. Continue Norvasc,  8. Remote history of GIB  Stable for transfer to telemetry. Discussed with Dr. Sharyn Lull by telephone who agrees.  Code Status: full code Family Communication: none at bedside Disposition Plan: home 1-2 days  Brendia Sacks, MD  [facility] Team 1 Pager [phone]. If 7PM-7AM, please contact night-coverage at [email or web address], password [facility] 05/15/2012, 9:04 AM  LOS: 1 day   Brief narrative: 78 year old woman presented with  afib with RVR. IAlso found to have an UTI.  Consultants:  Cardiology  Procedures:  2-d echocardiogram--LVEF 35-40%, diffuse hypokinesis.  Antibiotics:  Cipro 8/13>>  Rocephin 8/12>>8/12  HPI/Subjective: Feels better.  Objective: Filed Vitals:   05/15/12 0545 05/15/12 0635 05/15/12 0839 05/15/12 0841  BP: 193/111 169/101  152/86  Pulse: 90 87 80   Temp:   97.9 F (36.6 C)   TempSrc:   Oral   Resp: 21 26    Height:      Weight:      SpO2:   100%     Intake/Output Summary (Last 24 hours) at 05/15/12 0904 Last data filed at 05/15/12 0800  Gross per 24 hour  Intake 888.39 ml  Output   2950 ml  Net -2061.61 ml    Exam:   General:  Appears calm and comfortable, speech fluent and clear  Cardiovascular: RRR, no m/r/g.  Respiratory: right  crackles  Data Reviewed: Basic Metabolic Panel:  Lab 05/15/12 8295 05/14/12 1030  NA 141 142  K 4.0 3.7  CL 106 109  CO2 21 23  GLUCOSE 145* 98  BUN 15 17  CREATININE 0.51 0.53  CALCIUM 9.6 9.3  MG -- --  PHOS -- --   Liver Function Tests:  Lab 05/15/12 0515  AST 66*  ALT 99*  ALKPHOS 171*  BILITOT 0.9  PROT 7.1  ALBUMIN 3.3*   CBC:  Lab 05/15/12 0515 05/14/12 1030  WBC 4.3 3.8*  NEUTROABS -- 2.4  HGB 11.0* 10.6*  HCT 34.1* 32.4*  MCV 81.6 81.2  PLT PLATELET CLUMPS NOTED ON SMEAR, UNABLE TO ESTIMATE 137*   Cardiac Enzymes:  Lab 05/15/12 0515 05/14/12 2048 05/14/12 1714 05/14/12 1030  CKTOTAL 67 55 49 --  CKMB 3.3 2.9 2.7 --  CKMBINDEX -- -- -- --  TROPONINI <0.30 <0.30 <0.30 <0.30   BNP (last 3 results)  Basename 05/15/12 0515 05/14/12 1030  PROBNP 5624.0* 3674.0*    Recent Results (from the past 240 hour(s))  MRSA PCR SCREENING     Status: Normal   Collection Time   05/15/12  1:03 AM      Component Value Range Status Comment   MRSA by PCR NEGATIVE  NEGATIVE Final      Studies: Portable Chest 1 View  05/15/2012  *RADIOLOGY REPORT*  Clinical Data: Shortness of breath.  PORTABLE CHEST - 1 VIEW  Comparison:   the previous day's study  Findings: Moderate bilateral interstitial edema or infiltrates, with a predominately central distribution, increased since previous exam.  Bibasilar  airspace opacities, left greater than right, increased since previous study.  Probable small pleural effusions. Mild cardiomegaly.  Atheromatous aorta.  Vascular clips in the right axilla.  IMPRESSION:  1.  Interval worsening of bilateral edema/infiltrates with probable effusions.  Original Report Authenticated By: Thora Lance III, M.D.   Scheduled Meds:   . amiodarone  150 mg Intravenous Once  . amiodarone  150 mg Intravenous Once  . amLODipine  5 mg Oral Daily  . aspirin  162 mg Oral Once  . atorvastatin  20 mg Oral q1800  . cefTRIAXone (ROCEPHIN)  IV  1 g  Intravenous Once  . cloNIDine  0.1 mg Oral Daily  . diltiazem (CARDIZEM) infusion  5-15 mg/hr Intravenous Once  . diltiazem  10 mg Intravenous Once  . diltiazem  15 mg Intravenous Once  . enoxaparin (LOVENOX) injection  55 mg Subcutaneous BID  . furosemide      . furosemide  20 mg Intravenous Once  . furosemide  40 mg Intravenous Once  . metoprolol      . metoprolol  5 mg Intravenous Once  . metoprolol tartrate  25 mg Oral BID  . pantoprazole  40 mg Oral Q0600  . potassium chloride  40 mEq Oral Once  . sodium chloride  3 mL Intravenous Q12H  . DISCONTD: amiodarone  150 mg Intravenous Once  . DISCONTD: amiodarone  150 mg Intravenous Once  . DISCONTD: atorvastatin  20 mg Oral q1800  . DISCONTD: enoxaparin (LOVENOX) injection  55 mg Subcutaneous Q12H   Continuous Infusions:   . amiodarone (NEXTERONE PREMIX) 360 mg/200 mL dextrose 60 mg/hr (05/14/12 1806)   And  . amiodarone (NEXTERONE PREMIX) 360 mg/200 mL dextrose 30 mg/hr (05/15/12 0900)    Principal Problem:  *Atrial fibrillation with RVR Active Problems:  SOB (shortness of breath)  HTN (hypertension)  HLD (hyperlipidemia)  UTI (lower urinary tract infection)  History of GI bleed     Brendia Sacks, MD  [facility] Team 1 Pager [phone]. If 7PM-7AM, please contact night-coverage at [email or web address], password [facility] 05/15/2012, 9:04 AM  LOS: 1 day   Time spent: 25 minutes

## 2012-05-15 NOTE — Progress Notes (Signed)
Subjective:  Patient denies any chest pain states her breathing has improved. Patient converted last night to sinus rhythm and remains in sinus rhythm  Objective:  Vital Signs in the last 24 hours: Temp:  [97.5 F (36.4 C)-98.1 F (36.7 C)] 97.6 F (36.4 C) (08/14 1132) Pulse Rate:  [74-152] 74  (08/14 1132) Resp:  [19-35] 22  (08/14 1132) BP: (132-193)/(73-133) 141/73 mmHg (08/14 1132) SpO2:  [94 %-100 %] 98 % (08/14 1132) Weight:  [63.9 kg (140 lb 14 oz)-64.2 kg (141 lb 8.6 oz)] 63.9 kg (140 lb 14 oz) (08/14 0500)  Intake/Output from previous day: 08/13 0701 - 08/14 0700 In: 892.4 [P.O.:480; I.V.:408.4; IV Piggyback:4] Out: 2650 [Urine:2650] Intake/Output from this shift: Total I/O In: 426.8 [P.O.:320; I.V.:106.8] Out: 925 [Urine:925]  Physical Exam: Neck: no adenopathy, no carotid bruit, no JVD and supple, symmetrical, trachea midline Lungs: Decreased breath sound at bases with bibasilar Rales Heart: regular rate and rhythm, S1, S2 normal and Soft systolic murmur and S3 gallop noted Abdomen: soft, non-tender; bowel sounds normal; no masses,  no organomegaly Extremities: No clubbing cyanosis 1+ edema  Lab Results:  Basename 05/15/12 0515 05/14/12 1030  WBC 4.3 3.8*  HGB 11.0* 10.6*  PLT PLATELET CLUMPS NOTED ON SMEAR, UNABLE TO ESTIMATE 137*    Basename 05/15/12 0515 05/14/12 1030  NA 141 142  K 4.0 3.7  CL 106 109  CO2 21 23  GLUCOSE 145* 98  BUN 15 17  CREATININE 0.51 0.53    Basename 05/15/12 0515 05/14/12 2048  TROPONINI <0.30 <0.30   Hepatic Function Panel  Basename 05/15/12 0515  PROT 7.1  ALBUMIN 3.3*  AST 66*  ALT 99*  ALKPHOS 171*  BILITOT 0.9  BILIDIR --  IBILI --    Basename 05/15/12 0515  CHOL 124   No results found for this basename: PROTIME in the last 72 hours  Imaging: Imaging results have been reviewed and Portable Chest 1 View  05/15/2012  *RADIOLOGY REPORT*  Clinical Data: Shortness of breath.  PORTABLE CHEST - 1 VIEW   Comparison:   the previous day's study  Findings: Moderate bilateral interstitial edema or infiltrates, with a predominately central distribution, increased since previous exam.  Bibasilar airspace opacities, left greater than right, increased since previous study.  Probable small pleural effusions. Mild cardiomegaly.  Atheromatous aorta.  Vascular clips in the right axilla.  IMPRESSION:  1.  Interval worsening of bilateral edema/infiltrates with probable effusions.  Original Report Authenticated By: Osa Craver, M.D.   Dg Chest Port 1 View  05/14/2012  *RADIOLOGY REPORT*  Clinical Data: short of breath  PORTABLE CHEST - 1 VIEW  Comparison: 06/06/08  Findings: Cardiac silhouette is mildly enlarged.  No mediastinal or hilar masses or adenopathy.  Lungs show prominent bronchovascular markings, but otherwise clear.  Status post right mastectomy.  Bony thorax is intact.  IMPRESSION: No acute cardiopulmonary disease.  Original Report Authenticated By: Domenic Moras, M.D.    Cardiac Studies:  Assessment/Plan:  Status post A. fib with RVR Decompensated systolic heart failure probably tachycardia induced Hypertension Hypercholesteremia Degenerative joint disease History of CVA of breast Remote history of paroxysmal A. fib Plan Switch the IV amiodarone to by mouth Add ACE inhibitors DC Lovenox and switched to eliquis as per orders  LOS: 1 day    Amit Meloy N 05/15/2012, 12:28 PM

## 2012-05-15 NOTE — Care Management Note (Signed)
    Page 1 of 1   05/15/2012     10:20:59 AM   CARE MANAGEMENT NOTE 05/15/2012  Patient:  Cynthia Copeland, Cynthia Copeland   Account Number:  [id number]  Date Initiated:  05/15/2012  Documentation initiated by:  Junius Creamer  Subjective/Objective Assessment:   adm w at fib w rvr     Action/Plan:   lives alone, pcp dr Rinaldo Cloud   Anticipated DC Date:     Anticipated DC Plan:        DC Planning Services  CM consult      Choice offered to / List presented to:             Status of service:   Medicare Important Message given?   (If response is "NO", the following Medicare IM given date fields will be blank) Date Medicare IM given:   Date Additional Medicare IM given:    Discharge Disposition:    Per UR Regulation:  Reviewed for med. necessity/level of care/duration of stay  If discussed at Long Length of Stay Meetings, dates discussed:    Comments:  8/14 10:20a debbie [place] Acklin rn,bsn [phone]

## 2012-05-15 NOTE — Progress Notes (Signed)
  Echocardiogram 2D Echocardiogram has been performed.  Jorje Guild 05/15/2012, 12:16 PM

## 2012-05-16 DIAGNOSIS — E785 Hyperlipidemia, unspecified: Secondary | ICD-10-CM

## 2012-05-16 DIAGNOSIS — R7989 Other specified abnormal findings of blood chemistry: Secondary | ICD-10-CM

## 2012-05-16 DIAGNOSIS — E0590 Thyrotoxicosis, unspecified without thyrotoxic crisis or storm: Secondary | ICD-10-CM | POA: Diagnosis present

## 2012-05-16 LAB — HEPATIC FUNCTION PANEL
Albumin: 2.8 g/dL — ABNORMAL LOW (ref 3.5–5.2)
Alkaline Phosphatase: 137 U/L — ABNORMAL HIGH (ref 39–117)
Bilirubin, Direct: 0.2 mg/dL (ref 0.0–0.3)
Total Bilirubin: 0.9 mg/dL (ref 0.3–1.2)

## 2012-05-16 LAB — CBC
HCT: 30 % — ABNORMAL LOW (ref 36.0–46.0)
MCHC: 33.3 g/dL (ref 30.0–36.0)
MCV: 82.2 fL (ref 78.0–100.0)
RDW: 14.5 % (ref 11.5–15.5)

## 2012-05-16 LAB — BASIC METABOLIC PANEL
BUN: 18 mg/dL (ref 6–23)
Chloride: 104 mEq/L (ref 96–112)
Creatinine, Ser: 0.76 mg/dL (ref 0.50–1.10)
GFR calc Af Amer: >90 mL/min (ref >90)

## 2012-05-16 LAB — T3: T3, Total: 101.4 ng/dl (ref 80.0–204.0)

## 2012-05-16 MED ORDER — DICLOFENAC SODIUM 1 % TD GEL
Freq: Four times a day (QID) | TRANSDERMAL | Status: DC | PRN
  Administered 2012-05-16 – 2012-05-17 (×2): via TOPICAL
  Filled 2012-05-16: qty 100

## 2012-05-16 MED ORDER — METHIMAZOLE 10 MG PO TABS
10.0000 mg | ORAL_TABLET | Freq: Two times a day (BID) | ORAL | Status: DC
  Administered 2012-05-16 (×2): 10 mg via ORAL
  Filled 2012-05-16 (×4): qty 1

## 2012-05-16 NOTE — Progress Notes (Signed)
Subjective:  Patient denies any chest pain or shortness of breath. Patient remains in sinus rhythm.  Objective:  Vital Signs in the last 24 hours: Temp:  [97.6 F (36.4 C)-98.3 F (36.8 C)] 98.3 F (36.8 C) (08/15 0500) Pulse Rate:  [74-92] 85  (08/15 0500) Resp:  [17-22] 18  (08/15 0500) BP: (120-152)/(70-87) 124/70 mmHg (08/15 0500) SpO2:  [90 %-100 %] 93 % (08/15 0649)  Intake/Output from previous day: 08/14 0701 - 08/15 0700 In: 546.8 [P.O.:440; I.V.:106.8] Out: 1450 [Urine:1450] Intake/Output from this shift:    Physical Exam: Neck: no adenopathy, no carotid bruit, no JVD and supple, symmetrical, trachea midline Lungs: Decreased breath sound at bases. Air entry improved Heart: regular rate and rhythm, S1, S2 normal and Soft systolic murmur and S3 gallop noted Abdomen: soft, non-tender; bowel sounds normal; no masses,  no organomegaly Extremities: extremities normal, atraumatic, no cyanosis or edema  Lab Results:  Basename 05/15/12 0515 05/14/12 1030  WBC 4.3 3.8*  HGB 11.0* 10.6*  PLT PLATELET CLUMPS NOTED ON SMEAR, UNABLE TO ESTIMATE 137*    Basename 05/15/12 0515 05/14/12 1030  NA 141 142  K 4.0 3.7  CL 106 109  CO2 21 23  GLUCOSE 145* 98  BUN 15 17  CREATININE 0.51 0.53    Basename 05/15/12 1202 05/15/12 0515  TROPONINI <0.30 <0.30   Hepatic Function Panel  Basename 05/16/12 0545  PROT 6.0  ALBUMIN 2.8*  AST 48*  ALT 77*  ALKPHOS 137*  BILITOT 0.9  BILIDIR 0.2  IBILI 0.7    Basename 05/15/12 0515  CHOL 124   No results found for this basename: PROTIME in the last 72 hours  Imaging: Imaging results have been reviewed and Portable Chest 1 View  05/15/2012  *RADIOLOGY REPORT*  Clinical Data: Shortness of breath.  PORTABLE CHEST - 1 VIEW  Comparison:   the previous day's study  Findings: Moderate bilateral interstitial edema or infiltrates, with a predominately central distribution, increased since previous exam.  Bibasilar airspace opacities,  left greater than right, increased since previous study.  Probable small pleural effusions. Mild cardiomegaly.  Atheromatous aorta.  Vascular clips in the right axilla.  IMPRESSION:  1.  Interval worsening of bilateral edema/infiltrates with probable effusions.  Original Report Authenticated By: Osa Craver, M.D.   Dg Chest Port 1 View  05/14/2012  *RADIOLOGY REPORT*  Clinical Data: short of breath  PORTABLE CHEST - 1 VIEW  Comparison: 06/06/08  Findings: Cardiac silhouette is mildly enlarged.  No mediastinal or hilar masses or adenopathy.  Lungs show prominent bronchovascular markings, but otherwise clear.  Status post right mastectomy.  Bony thorax is intact.  IMPRESSION: No acute cardiopulmonary disease.  Original Report Authenticated By: Domenic Moras, M.D.    Cardiac Studies:  Assessment/Plan:  Status post A. fib with RVR  Decompensated systolic heart failure probably tachycardia induced  Nonischemic cardiomyopathy Hypertension  Hypercholesteremia  Degenerative joint disease  History of CA of breast  Remote history of paroxysmal A. fib Hyperthyroidism Plan Increase ACE inhibitors and beta blockers as tolerated Consider Rx for hyperthyroidism Continue amiodarone 200 mg twice daily for one week and then 200 mg daily  LOS: 2 days    Bowen Goyal N 05/16/2012, 8:02 AM

## 2012-05-16 NOTE — Progress Notes (Signed)
Patient ID: Cynthia Copeland  female  ZOX:[id number]    DOB: 03-15-1932    DOA: 05/14/2012  PCP: Robynn Pane, MD  Subjective: Feeling a lot better today, denies any chest pain or shortness of breath, in sinus rhythm. Denies fever, chills, nausea, vomiting, abdominal pain.  Objective: Weight change:   Intake/Output Summary (Last 24 hours) at 05/16/12 0930 Last data filed at 05/15/12 1900  Gross per 24 hour  Intake  346.8 ml  Output   1150 ml  Net -803.2 ml   Blood pressure 124/70, pulse 85, temperature 98.3 F (36.8 C), temperature source Oral, resp. rate 18, height 5\' 1"  (1.549 m), weight 63.9 kg (140 lb 14 oz), SpO2 93.00%.  Physical Exam: General: Alert and awake, oriented x3, not in any acute distress. HEENT: anicteric sclera, pupils reactive to light and accommodation, EOMI CVS: S1-S2 clear, no murmur rubs or gallops Chest: Decreased breath sound at the bases, no wheezing, rales or rhonchi Abdomen: soft nontender, nondistended, normal bowel sounds, no organomegaly Extremities: no cyanosis, clubbing or edema noted bilaterally Neuro: Cranial nerves II-XII intact, no focal neurological deficits  Lab Results: Basic Metabolic Panel:  Lab 05/15/12 8119 05/14/12 1030  NA 141 142  K 4.0 3.7  CL 106 109  CO2 21 23  GLUCOSE 145* 98  BUN 15 17  CREATININE 0.51 0.53  CALCIUM 9.6 9.3  MG -- --  PHOS -- --   Liver Function Tests:  Lab 05/16/12 0545 05/15/12 0515  AST 48* 66*  ALT 77* 99*  ALKPHOS 137* 171*  BILITOT 0.9 0.9  PROT 6.0 7.1  ALBUMIN 2.8* 3.3*   No results found for this basename: LIPASE:2,AMYLASE:2 in the last 168 hours No results found for this basename: AMMONIA:2 in the last 168 hours CBC:  Lab 05/15/12 0515 05/14/12 1030  WBC 4.3 3.8*  NEUTROABS -- 2.4  HGB 11.0* 10.6*  HCT 34.1* 32.4*  MCV 81.6 81.2  PLT PLATELET CLUMPS NOTED ON SMEAR, UNABLE TO ESTIMATE 137*   Cardiac Enzymes:  Lab 05/15/12 1202 05/15/12 0515 05/14/12 2048  CKTOTAL 68 67  55  CKMB 4.2* 3.3 2.9  CKMBINDEX -- -- --  TROPONINI <0.30 <0.30 <0.30   BNP: No components found with this basename: POCBNP:2 CBG: No results found for this basename: GLUCAP:5 in the last 168 hours   Micro Results: Recent Results (from the past 240 hour(s))  URINE CULTURE     Status: Normal   Collection Time   05/14/12 12:04 PM      Component Value Range Status Comment   Specimen Description URINE, CLEAN CATCH   Final    Special Requests NONE   Final    Culture  Setup Time 05/14/2012 14:44   Final    Colony Count 15,000 COLONIES/ML   Final    Culture     Final    Value: Multiple bacterial morphotypes present, none predominant. Suggest appropriate recollection if clinically indicated.   Report Status 05/15/2012 FINAL   Final   MRSA PCR SCREENING     Status: Normal   Collection Time   05/15/12  1:03 AM      Component Value Range Status Comment   MRSA by PCR NEGATIVE  NEGATIVE Final     Studies/Results: Portable Chest 1 View  05/15/2012  *RADIOLOGY REPORT*  Clinical Data: Shortness of breath.  PORTABLE CHEST - 1 VIEW  Comparison:   the previous day's study  Findings: Moderate bilateral interstitial edema or infiltrates, with a predominately central distribution, increased  since previous exam.  Bibasilar airspace opacities, left greater than right, increased since previous study.  Probable small pleural effusions. Mild cardiomegaly.  Atheromatous aorta.  Vascular clips in the right axilla.  IMPRESSION:  1.  Interval worsening of bilateral edema/infiltrates with probable effusions.  Original Report Authenticated By: Osa Craver, M.D.   Dg Chest Port 1 View  05/14/2012  *RADIOLOGY REPORT*  Clinical Data: short of breath  PORTABLE CHEST - 1 VIEW  Comparison: 06/06/08  Findings: Cardiac silhouette is mildly enlarged.  No mediastinal or hilar masses or adenopathy.  Lungs show prominent bronchovascular markings, but otherwise clear.  Status post right mastectomy.  Bony thorax is  intact.  IMPRESSION: No acute cardiopulmonary disease.  Original Report Authenticated By: Domenic Moras, M.D.    Medications: Scheduled Meds:   . amiodarone  150 mg Intravenous Once  . amiodarone  200 mg Oral BID  . amLODipine  5 mg Oral Daily  . apixaban  5 mg Oral BID  . atorvastatin  20 mg Oral q1800  . ciprofloxacin  250 mg Oral BID  . cloNIDine  0.1 mg Oral Daily  . furosemide      . furosemide  40 mg Oral Daily  . metoprolol      . metoprolol tartrate  25 mg Oral BID  . pantoprazole  40 mg Oral Q0600  . potassium chloride  10 mEq Oral Daily  . ramipril  5 mg Oral Daily  . sodium chloride  3 mL Intravenous Q12H  . DISCONTD: antiseptic oral rinse  15 mL Mouth Rinse BID  . DISCONTD: antiseptic oral rinse  15 mL Mouth Rinse q12n4p  . DISCONTD: enoxaparin (LOVENOX) injection  55 mg Subcutaneous BID   Continuous Infusions:   . DISCONTD: amiodarone (NEXTERONE PREMIX) 360 mg/200 mL dextrose 30 mg/hr (05/15/12 0900)     Assessment/Plan: Principal Problem:  *Atrial fibrillation with RVR:  - Resolved, currently in normal sinus rhythm - Discussed with Dr. Sharyn Lull, management per cardiology, continue amiodarone and eliquis  Active Problems:  HTN (hypertension): Stable, continue Norvasc and Lopressor   HLD (hyperlipidemia): Continue Lipitor   UTI (lower urinary tract infection): On Cipro   History of GI bleed: No active bleeding, remote history of GI bleed   Acute respiratory failure: Resolved likely scheduled to A. fib with RVR, acute CHF   Acute systolic CHF (congestive heart failure): EF 35-40%, Clinically improved, continue Lasix, beta blocker, ACE inhibitor, per cardiology   Elevated LFTs: Improving  Hyperthyroidism: TSH <0.008, Obtain T3, T4, will start on Tapazole  DVT Prophylaxis: on eliquis  Code Status: Full  Disposition: Hopefully tomorrow   LOS: 2 days   Annaclaire Walsworth M.D. [facility] 05/16/2012, 9:30 AM Pager: [phone]  If  7PM-7AM, please contact night-coverage [email or web address] Password [email or web address]

## 2012-05-17 DIAGNOSIS — D649 Anemia, unspecified: Secondary | ICD-10-CM

## 2012-05-17 DIAGNOSIS — Z8719 Personal history of other diseases of the digestive system: Secondary | ICD-10-CM

## 2012-05-17 MED ORDER — METHIMAZOLE 10 MG PO TABS
10.0000 mg | ORAL_TABLET | Freq: Every day | ORAL | Status: DC
  Administered 2012-05-17: 10 mg via ORAL
  Filled 2012-05-17: qty 1

## 2012-05-17 MED ORDER — POTASSIUM CHLORIDE CRYS ER 10 MEQ PO TBCR: 10.0000 meq | EXTENDED_RELEASE_TABLET | Freq: Every day | ORAL | Status: DC

## 2012-05-17 MED ORDER — APIXABAN 5 MG PO TABS: 5.0000 mg | ORAL_TABLET | Freq: Two times a day (BID) | ORAL | Status: DC

## 2012-05-17 MED ORDER — DICLOFENAC SODIUM 1 % TD GEL: 1.0000 "application " | Freq: Four times a day (QID) | TRANSDERMAL | Status: DC | PRN

## 2012-05-17 MED ORDER — RAMIPRIL 5 MG PO CAPS: 5.0000 mg | ORAL_CAPSULE | Freq: Every day | ORAL | Status: DC

## 2012-05-17 MED ORDER — FUROSEMIDE 40 MG PO TABS: 40.0000 mg | ORAL_TABLET | Freq: Every day | ORAL | Status: DC

## 2012-05-17 MED ORDER — METHIMAZOLE 10 MG PO TABS: 10.0000 mg | ORAL_TABLET | Freq: Every day | ORAL | Status: DC

## 2012-05-17 MED ORDER — CLONIDINE HCL 0.1 MG PO TABS: 0.1000 mg | ORAL_TABLET | Freq: Every day | ORAL | Status: DC

## 2012-05-17 MED ORDER — METOPROLOL TARTRATE 25 MG PO TABS: 25.0000 mg | ORAL_TABLET | Freq: Two times a day (BID) | ORAL | Status: DC

## 2012-05-17 MED ORDER — AMIODARONE HCL 200 MG PO TABS: 200.0000 mg | ORAL_TABLET | Freq: Two times a day (BID) | ORAL | Status: DC

## 2012-05-17 NOTE — Discharge Summary (Signed)
Physician Discharge Summary  Patient ID: Cynthia Copeland MRN: [id number] DOB/AGE: Sep 20, 1932 78 y.o.  Admit date: 05/14/2012 Discharge date: 05/17/2012  Primary Care Physician:  Robynn Pane, MD  Discharge Diagnoses:    .Atrial fibrillation with RVR .HTN (hypertension) .HLD (hyperlipidemia) .UTI (lower urinary tract infection), urine culture negative .Acute respiratory failure resolved .Acute systolic CHF (congestive heart failure) .Elevated LFTs .Normocytic anemia .Hyperthyroidism   Consults:  Cardiology: Dr Sharyn Lull  Discharge Medications: Medication List  As of 05/17/2012  9:33 AM   TAKE these medications         amiodarone 200 MG tablet   Commonly known as: PACERONE   Take 1 tablet (200 mg total) by mouth 2 (two) times daily. Please take 200mg  twice/day for 1 week, then change to once a day      amLODipine 5 MG tablet   Commonly known as: NORVASC   Take 5 mg by mouth daily.      apixaban 5 MG Tabs tablet   Commonly known as: ELIQUIS   Take 1 tablet (5 mg total) by mouth 2 (two) times daily.      cloNIDine 0.1 MG tablet   Commonly known as: CATAPRES   Take 1 tablet (0.1 mg total) by mouth daily.      Cod Liver Oil Caps   Take 2 capsules by mouth daily.      CRANBERRY PO   Take 1 tablet by mouth daily.      diclofenac sodium 1 % Gel   Commonly known as: VOLTAREN   Apply 1 application topically 4 (four) times daily as needed (for knee pain).      furosemide 40 MG tablet   Commonly known as: LASIX   Take 1 tablet (40 mg total) by mouth daily.      methimazole 10 MG tablet   Commonly known as: TAPAZOLE   Take 1 tablet (10 mg total) by mouth daily.      metoprolol tartrate 25 MG tablet   Commonly known as: LOPRESSOR   Take 1 tablet (25 mg total) by mouth 2 (two) times daily.      multivitamin with minerals Tabs   Take 1 tablet by mouth daily.      OMEGA 3 PO   Take 1 capsule by mouth daily.      potassium chloride 10 MEQ tablet   Commonly known  as: K-DUR,KLOR-CON   Take 1 tablet (10 mEq total) by mouth daily.      ramipril 5 MG capsule   Commonly known as: ALTACE   Take 1 capsule (5 mg total) by mouth daily.      rosuvastatin 10 MG tablet   Commonly known as: CRESTOR   Take 10 mg by mouth daily.      VITAMIN B 12 PO   Take 1 tablet by mouth daily.             Brief H and P: For complete details please refer to admission H and P, but in brief 78 year old female who presented with shortness of breath admission. Patient called EMS and was found to be in A. fib with RVR. In ED she was given IV metoprolol but did not respond. She was then started on IV Cardizem. She also was found to have a UTI and placed on Rocephin. She was admitted for further workup  Hospital Course: 78 year old female who presented with A. fib with RVR and also found to have UTI. Atrial fibrillation with RVR:  Resolved, currently in  normal sinus rhythm. Patient was initially given IV metoprolol in the ED and had no significant response. She was then started on IV Cardizem and was admitted to step down unit. Cardiology was consulted and patient was followed by Dr. Sharyn Lull. She was given IV amiodarone bolus, and then subsequently placed on oral amiodarone and eliquis for anticoagulation.  HTN (hypertension): Stable, continue Norvasc and Lopressor  HLD (hyperlipidemia): Continue Lipitor  UTI (lower urinary tract infection): Urine culture was negative, patient has received 3 days of IV ciprofloxacin and does not need further antibiotics.   History of GI bleed: No active bleeding, remote history of GI bleed. Patient was explained to monitor for any GI bleeding while on eliquis.  Acute respiratory failure: Resolved likely scheduled to A. fib with RVR, acute CHF  Acute systolic CHF (congestive heart failure): EF 35-40%, Clinically improved, continue Lasix, beta blocker, ACE inhibitor. Elevated LFTs: Improving  Hyperthyroidism: TSH <0.008, T3 within normal range at  101.4, T4 elevated at 2.36, patient is started on Tapazole. Repeat TSH, T4 and T3 in 4 weeks.    Day of Discharge BP 159/85  Pulse 86  Temp 97.9 F (36.6 C) (Oral)  Resp 19  Ht 5\' 1"  (1.549 m)  Wt 63.9 kg (140 lb 14 oz)  BMI 26.62 kg/m2  SpO2 92%  Physical Exam: General: Alert and awake oriented x3 not in any acute distress. HEENT: anicteric sclera, pupils reactive to light and accommodation CVS: S1-S2 clear no murmur rubs or gallops Chest: clear to auscultation bilaterally, no wheezing rales or rhonchi Abdomen: soft nontender, nondistended, normal bowel sounds, no organomegaly Extremities: no cyanosis, clubbing or edema noted bilaterally Neuro: Cranial nerves II-XII intact, no focal neurological deficits   The results of significant diagnostics from this hospitalization (including imaging, microbiology, ancillary and laboratory) are listed below for reference.    LAB RESULTS: Basic Metabolic Panel:  Lab 05/16/12 1191 05/25/12 0515  NA 142 141  K 3.5 4.0  CL 104 106  CO2 28 21  GLUCOSE 103* 145*  BUN 18 15  CREATININE 0.76 0.51  CALCIUM 9.1 9.6  MG -- --  PHOS -- --   Liver Function Tests:  Lab 05/16/12 0545 25-May-2012 0515  AST 48* 66*  ALT 77* 99*  ALKPHOS 137* 171*  BILITOT 0.9 0.9  PROT 6.0 7.1  ALBUMIN 2.8* 3.3*   CBC:  Lab 05/16/12 0927 2012-05-25 0515 05/14/12 1030  WBC 5.0 4.3 --  NEUTROABS -- -- 2.4  HGB 10.0* 11.0* --  HCT 30.0* 34.1* --  MCV 82.2 -- --  PLT 127* PLATELET CLUMPS NOTED ON SMEAR, UNABLE TO ESTIMATE --   Cardiac Enzymes:  Lab 05-25-12 1202 05-25-2012 0515  CKTOTAL 68 67  CKMB 4.2* 3.3  CKMBINDEX -- --  TROPONINI <0.30 <0.30   BNP: No components found with this basename: POCBNP:2 CBG: No results found for this basename: GLUCAP:2 in the last 168 hours  Significant Diagnostic Studies:  Portable Chest 1 View  05-25-12  *RADIOLOGY REPORT*  Clinical Data: Shortness of breath.  PORTABLE CHEST - 1 VIEW  Comparison:   the  previous day's study  Findings: Moderate bilateral interstitial edema or infiltrates, with a predominately central distribution, increased since previous exam.  Bibasilar airspace opacities, left greater than right, increased since previous study.  Probable small pleural effusions. Mild cardiomegaly.  Atheromatous aorta.  Vascular clips in the right axilla.  IMPRESSION:  1.  Interval worsening of bilateral edema/infiltrates with probable effusions.  Original Report Authenticated By: Lysle Rubens  Andria Rhein, M.D.   Dg Chest Port 1 View  05/14/2012  *RADIOLOGY REPORT*  Clinical Data: short of breath  PORTABLE CHEST - 1 VIEW  Comparison: 06/06/08  Findings: Cardiac silhouette is mildly enlarged.  No mediastinal or hilar masses or adenopathy.  Lungs show prominent bronchovascular markings, but otherwise clear.  Status post right mastectomy.  Bony thorax is intact.  IMPRESSION: No acute cardiopulmonary disease.  Original Report Authenticated By: Domenic Moras, M.D.     Disposition and Follow-up: Discharge Orders    Future Orders Please Complete By Expires   Diet - low sodium heart healthy      Increase activity slowly      (HEART FAILURE PATIENTS) Call MD:  Anytime you have any of the following symptoms: 1) 3 pound weight gain in 24 hours or 5 pounds in 1 week 2) shortness of breath, with or without a dry hacking cough 3) swelling in the hands, feet or stomach 4) if you have to sleep on extra pillows at night in order to breathe.          DISPOSITION: Home  DIET: Heart healthy   ACTIVITYAs tolerated   DISCHARGE FOLLOW-UP Follow-up Information    Follow up with Robynn Pane, MD. Schedule an appointment as soon as possible for a visit in 1 week. (for hospital follow-up)    Contact information:   [address] [place] [address] [phone]          Time spent on Discharge: 45 Minutes   Signed:   RAI,RIPUDEEP M.D. [facility] 05/17/2012, 9:33 AM Pager: [phone]  If 7PM-7AM, please contact night-coverage [email or web address] Password [email or web address]

## 2012-05-17 NOTE — Plan of Care (Signed)
Problem: Phase III Progression Outcomes Goal: Other Phase III Outcomes/Goals Outcome: Progressing Heart failure education initiated.

## 2012-05-29 ENCOUNTER — Emergency Department (HOSPITAL_COMMUNITY)
Admission: EM | Admit: 2012-05-29 | Discharge: 2012-05-30 | Disposition: A | Discharge status: Home / Self Care | Payer: Medicare Other | Attending: Emergency Medicine | Admitting: Emergency Medicine

## 2012-05-29 ENCOUNTER — Emergency Department (HOSPITAL_COMMUNITY): Payer: Medicare Other

## 2012-05-29 ENCOUNTER — Other Ambulatory Visit: Payer: Self-pay

## 2012-05-29 ENCOUNTER — Encounter (HOSPITAL_COMMUNITY): Payer: Self-pay | Admitting: *Deleted

## 2012-05-29 DIAGNOSIS — Z880 Allergy status to penicillin: Secondary | ICD-10-CM | POA: Insufficient documentation

## 2012-05-29 DIAGNOSIS — M129 Arthropathy, unspecified: Secondary | ICD-10-CM | POA: Insufficient documentation

## 2012-05-29 DIAGNOSIS — Z79899 Other long term (current) drug therapy: Secondary | ICD-10-CM | POA: Insufficient documentation

## 2012-05-29 DIAGNOSIS — J069 Acute upper respiratory infection, unspecified: Secondary | ICD-10-CM | POA: Insufficient documentation

## 2012-05-29 DIAGNOSIS — E7800 Pure hypercholesterolemia, unspecified: Secondary | ICD-10-CM | POA: Insufficient documentation

## 2012-05-29 DIAGNOSIS — R0602 Shortness of breath: Secondary | ICD-10-CM | POA: Insufficient documentation

## 2012-05-29 DIAGNOSIS — Z96659 Presence of unspecified artificial knee joint: Secondary | ICD-10-CM | POA: Insufficient documentation

## 2012-05-29 DIAGNOSIS — I10 Essential (primary) hypertension: Secondary | ICD-10-CM | POA: Insufficient documentation

## 2012-05-29 DIAGNOSIS — Z87891 Personal history of nicotine dependence: Secondary | ICD-10-CM | POA: Insufficient documentation

## 2012-05-29 LAB — CBC WITH DIFFERENTIAL/PLATELET
Basophils Absolute: 0 10*3/uL (ref 0.0–0.1)
Basophils Relative: 0 % (ref 0–1)
Eosinophils Absolute: 0.3 10*3/uL (ref 0.0–0.7)
MCH: 26.1 pg (ref 26.0–34.0)
MCHC: 32.2 g/dL (ref 30.0–36.0)
Neutrophils Relative %: 65 % (ref 43–77)
Platelets: 215 10*3/uL (ref 150–400)
RBC: 4.79 MIL/uL (ref 3.87–5.11)

## 2012-05-29 LAB — COMPREHENSIVE METABOLIC PANEL
ALT: 36 U/L — ABNORMAL HIGH (ref 0–35)
Albumin: 3.5 g/dL (ref 3.5–5.2)
Alkaline Phosphatase: 166 U/L — ABNORMAL HIGH (ref 39–117)
Potassium: 3.7 mEq/L (ref 3.5–5.1)
Sodium: 138 mEq/L (ref 135–145)
Total Protein: 8 g/dL (ref 6.0–8.3)

## 2012-05-29 LAB — POCT I-STAT TROPONIN I: Troponin i, poc: 0 ng/mL (ref 0.00–0.08)

## 2012-05-29 MED ORDER — ALBUTEROL SULFATE (5 MG/ML) 0.5% IN NEBU
5.0000 mg | INHALATION_SOLUTION | Freq: Once | RESPIRATORY_TRACT | Status: AC
  Administered 2012-05-29: 5 mg via RESPIRATORY_TRACT
  Filled 2012-05-29: qty 1

## 2012-05-29 MED ORDER — MOXIFLOXACIN HCL 400 MG PO TABS: 400.0000 mg | ORAL_TABLET | Freq: Every day | ORAL | Status: AC

## 2012-05-29 MED ORDER — MOXIFLOXACIN HCL 400 MG PO TABS
400.0000 mg | ORAL_TABLET | Freq: Once | ORAL | Status: AC
  Administered 2012-05-29: 400 mg via ORAL
  Filled 2012-05-29: qty 1

## 2012-05-29 MED ORDER — IPRATROPIUM BROMIDE 0.02 % IN SOLN
0.5000 mg | Freq: Once | RESPIRATORY_TRACT | Status: AC
  Administered 2012-05-29: 0.5 mg via RESPIRATORY_TRACT
  Filled 2012-05-29: qty 2.5

## 2012-05-29 MED ORDER — ALBUTEROL SULFATE HFA 108 (90 BASE) MCG/ACT IN AERS
2.0000 | INHALATION_SPRAY | RESPIRATORY_TRACT | Status: DC | PRN
  Administered 2012-05-29: 2 via RESPIRATORY_TRACT
  Filled 2012-05-29: qty 6.7

## 2012-05-29 NOTE — ED Notes (Addendum)
Pt in via EMS, per EMS- pt in c/o shortness of breath and fever per patient, seen for same last week, told she had a thyroid issue, pt with productive cough x1 day, speaking in full sentences, no distress noted. Pt ambulatory upon EMS arrival without difficulty.

## 2012-05-29 NOTE — ED Notes (Signed)
Pt undressed, placed on monitor and pulseox

## 2012-05-29 NOTE — ED Provider Notes (Addendum)
History     CSN: [id number]  Arrival date & time 05/29/12  2151   First MD Initiated Contact with Patient 05/29/12 2154      Chief Complaint  Patient presents with  . Shortness of Breath  . Fever    (Consider location/radiation/quality/duration/timing/severity/associated sxs/prior treatment) Patient is a 78 y.o. female presenting with shortness of breath and fever. The history is provided by the patient.  Shortness of Breath  The current episode started today. The onset was gradual. The problem occurs continuously (waxing and waning all day but worse tonight). The problem has been gradually worsening. The problem is moderate. The symptoms are relieved by rest. The symptoms are aggravated by activity. Associated symptoms include a fever, cough and shortness of breath. Pertinent negatives include no chest pain, no chest pressure, no rhinorrhea, no sore throat and no wheezing. She has had no prior steroid use. Her past medical history is significant for past wheezing. Her past medical history does not include asthma. The last void occurred less than 6 hours ago. There were no sick contacts. Recent Medical Care: hospitalized last week for a.fib.  Fever Primary symptoms of the febrile illness include fever, cough and shortness of breath. Primary symptoms do not include wheezing.  The patient's medical history does not include asthma.    Past Medical History  Diagnosis Date  . Hypertension   . Hypercholesteremia   . Arthritis     Past Surgical History  Procedure Date  . Appendectomy   . Tubal ligation   . Joint replacement 2011    left knee replacement  . Breast surgery 2010    Rt. mastectomy    History reviewed. No pertinent family history.  History  Substance Use Topics  . Smoking status: Former Smoker    Types: Cigarettes  . Smokeless tobacco: Never Used  . Alcohol Use: No    OB History    Grav Para Term Preterm Abortions TAB SAB Ect Mult Living                   Review of Systems  Constitutional: Positive for fever.  HENT: Negative for sore throat and rhinorrhea.   Respiratory: Positive for cough and shortness of breath. Negative for wheezing.   Cardiovascular: Negative for chest pain.  All other systems reviewed and are negative.    Allergies  Penicillins  Home Medications   Current Outpatient Rx  Name Route Sig Dispense Refill  . AMLODIPINE BESYLATE 5 MG PO TABS Oral Take 5 mg by mouth daily.    . APIXABAN 5 MG PO TABS Oral Take 1 tablet (5 mg total) by mouth 2 (two) times daily. 60 tablet 3  . CLONIDINE HCL 0.1 MG PO TABS Oral Take 1 tablet (0.1 mg total) by mouth daily. 60 tablet   . COD LIVER OIL PO CAPS Oral Take 2 capsules by mouth daily.    Marland Kitchen CRANBERRY PO Oral Take 1 tablet by mouth daily.     Marland Kitchen VITAMIN B 12 PO Oral Take 1 tablet by mouth daily.    Marland Kitchen DICLOFENAC SODIUM 1 % TD GEL Topical Apply 1 application topically 4 (four) times daily as needed (for knee pain). 1 Tube 3  . FUROSEMIDE 40 MG PO TABS Oral Take 1 tablet (40 mg total) by mouth daily. 30 tablet 3  . METHIMAZOLE 10 MG PO TABS Oral Take 1 tablet (10 mg total) by mouth daily. 30 tablet 3  . METOPROLOL TARTRATE 25 MG PO TABS Oral Take  1 tablet (25 mg total) by mouth 2 (two) times daily. 60 tablet 3  . ADULT MULTIVITAMIN W/MINERALS CH Oral Take 1 tablet by mouth daily.    . OMEGA 3 PO Oral Take 1 capsule by mouth daily.     Marland Kitchen POTASSIUM CHLORIDE CRYS ER 10 MEQ PO TBCR Oral Take 1 tablet (10 mEq total) by mouth daily. 30 tablet 3  . RAMIPRIL 5 MG PO CAPS Oral Take 1 capsule (5 mg total) by mouth daily. 30 capsule 3  . ROSUVASTATIN CALCIUM 10 MG PO TABS Oral Take 10 mg by mouth daily.      BP 159/90  Pulse 79  Temp 98 F (36.7 C) (Oral)  Resp 20  SpO2 93%  Physical Exam  Nursing note and vitals reviewed. Constitutional: She is oriented to person, place, and time. She appears well-developed and well-nourished. No distress.  HENT:  Head: Normocephalic and  atraumatic.  Mouth/Throat: Oropharynx is clear and moist.  Eyes: Conjunctivae and EOM are normal. Pupils are equal, round, and reactive to light.  Neck: Normal range of motion. Neck supple.  Cardiovascular: Normal rate, regular rhythm and intact distal pulses.   No murmur heard. Pulmonary/Chest: Effort normal. No respiratory distress. She has no wheezes. She has rhonchi in the left upper field and the left middle field. She has no rales.  Abdominal: Soft. She exhibits no distension. There is no tenderness. There is no rebound and no guarding.  Musculoskeletal: Normal range of motion. She exhibits no edema and no tenderness.  Neurological: She is alert and oriented to person, place, and time.  Skin: Skin is warm and dry. No rash noted. No erythema.  Psychiatric: She has a normal mood and affect. Her behavior is normal.    ED Course  Procedures (including critical care time)  Labs Reviewed  CBC WITH DIFFERENTIAL - Abnormal; Notable for the following:    Eosinophils Relative 6 (*)     All other components within normal limits  COMPREHENSIVE METABOLIC PANEL - Abnormal; Notable for the following:    Glucose, Bld 149 (*)     ALT 36 (*)     Alkaline Phosphatase 166 (*)     GFR calc non Af Amer 62 (*)     GFR calc Af Amer 72 (*)     All other components within normal limits  POCT I-STAT TROPONIN I   Dg Chest 2 View  05/29/2012  *RADIOLOGY REPORT*  Clinical Data: Productive cough  CHEST - 2 VIEW  Comparison: 05/15/2012  Findings: Cardiomegaly.  Mild central vascular congestion. Interval clearing of the lower lobe consolidations.  Surgical clips right axilla status post right mastectomy.  Aortic arch atherosclerotic calcification.  No pneumothorax or pleural effusion.  No interval osseous change.  Leftward curvature of the thoracolumbar spine with multilevel degenerative changes.  IMPRESSION: Interval improved aeration/clearing of the lung base opacities.  Cardiomegaly with central vascular  congestion.   Original Report Authenticated By: Waneta Martins, M.D.      1. SOB (shortness of breath)   2. URI (upper respiratory infection)       MDM   Patient with new onset cough and shortness of breath for the last 24 hours. She states it got a little better this afternoon but then worsened again tonight. She denies productive cough or fever however on exam she has some rhonchi over the left lung fields concerning for possible new infectious etiology. She was admitted to the hospital and discharged over a week ago after  having A. fib. Patient denies any chest pain or palpitations today. She is satting 92% on oxygen which she's not normally on at home. She has no signs of fluid overload today. Patient has no history of COPD and is not a smoker however will give albuterol and check chest x-ray, CBC, CMP, EKG to further evaluate the  11:16 PM Labs are unrevealing.  CXR without acute findings and improved aeration from prior CHF.  After breathing treatment pt feels better.  Rhonchi resolved and moving air better.  Off O2 pt is 95% on RA which looking back and prior hospital records is her baseline.  Will treat for early infection and due to recent hospitalization will start on avelox      Gwyneth Sprout, MD 05/29/12 1610  Gwyneth Sprout, MD 05/29/12 2337

## 2012-09-06 ENCOUNTER — Other Ambulatory Visit: Payer: Self-pay | Admitting: Oncology

## 2012-09-06 DIAGNOSIS — Z1231 Encounter for screening mammogram for malignant neoplasm of breast: Secondary | ICD-10-CM

## 2012-09-06 DIAGNOSIS — Z9010 Acquired absence of unspecified breast and nipple: Secondary | ICD-10-CM

## 2012-10-15 ENCOUNTER — Ambulatory Visit
Admission: RE | Admit: 2012-10-15 | Discharge: 2012-10-15 | Disposition: A | Discharge status: Home / Self Care | Payer: Medicare Other | Source: Ambulatory Visit | Attending: Oncology | Admitting: Oncology

## 2012-10-15 DIAGNOSIS — Z9010 Acquired absence of unspecified breast and nipple: Secondary | ICD-10-CM

## 2012-10-15 DIAGNOSIS — Z1231 Encounter for screening mammogram for malignant neoplasm of breast: Secondary | ICD-10-CM

## 2012-10-18 ENCOUNTER — Other Ambulatory Visit: Payer: Self-pay | Admitting: Oncology

## 2012-10-18 DIAGNOSIS — R928 Other abnormal and inconclusive findings on diagnostic imaging of breast: Secondary | ICD-10-CM

## 2012-10-31 ENCOUNTER — Other Ambulatory Visit: Payer: Medicare Other

## 2012-11-12 ENCOUNTER — Ambulatory Visit
Admission: RE | Admit: 2012-11-12 | Discharge: 2012-11-12 | Disposition: A | Discharge status: Home / Self Care | Payer: Medicare Other | Source: Ambulatory Visit | Attending: Oncology | Admitting: Oncology

## 2012-11-12 DIAGNOSIS — R928 Other abnormal and inconclusive findings on diagnostic imaging of breast: Secondary | ICD-10-CM

## 2013-08-14 IMAGING — CR DG CHEST 1V PORT
1 series · 1 of 1 positions shown · non-contrast
Comparison: 06/06/08

CLINICAL DATA: short of breath

PORTABLE CHEST - 1 VIEW

[AP]
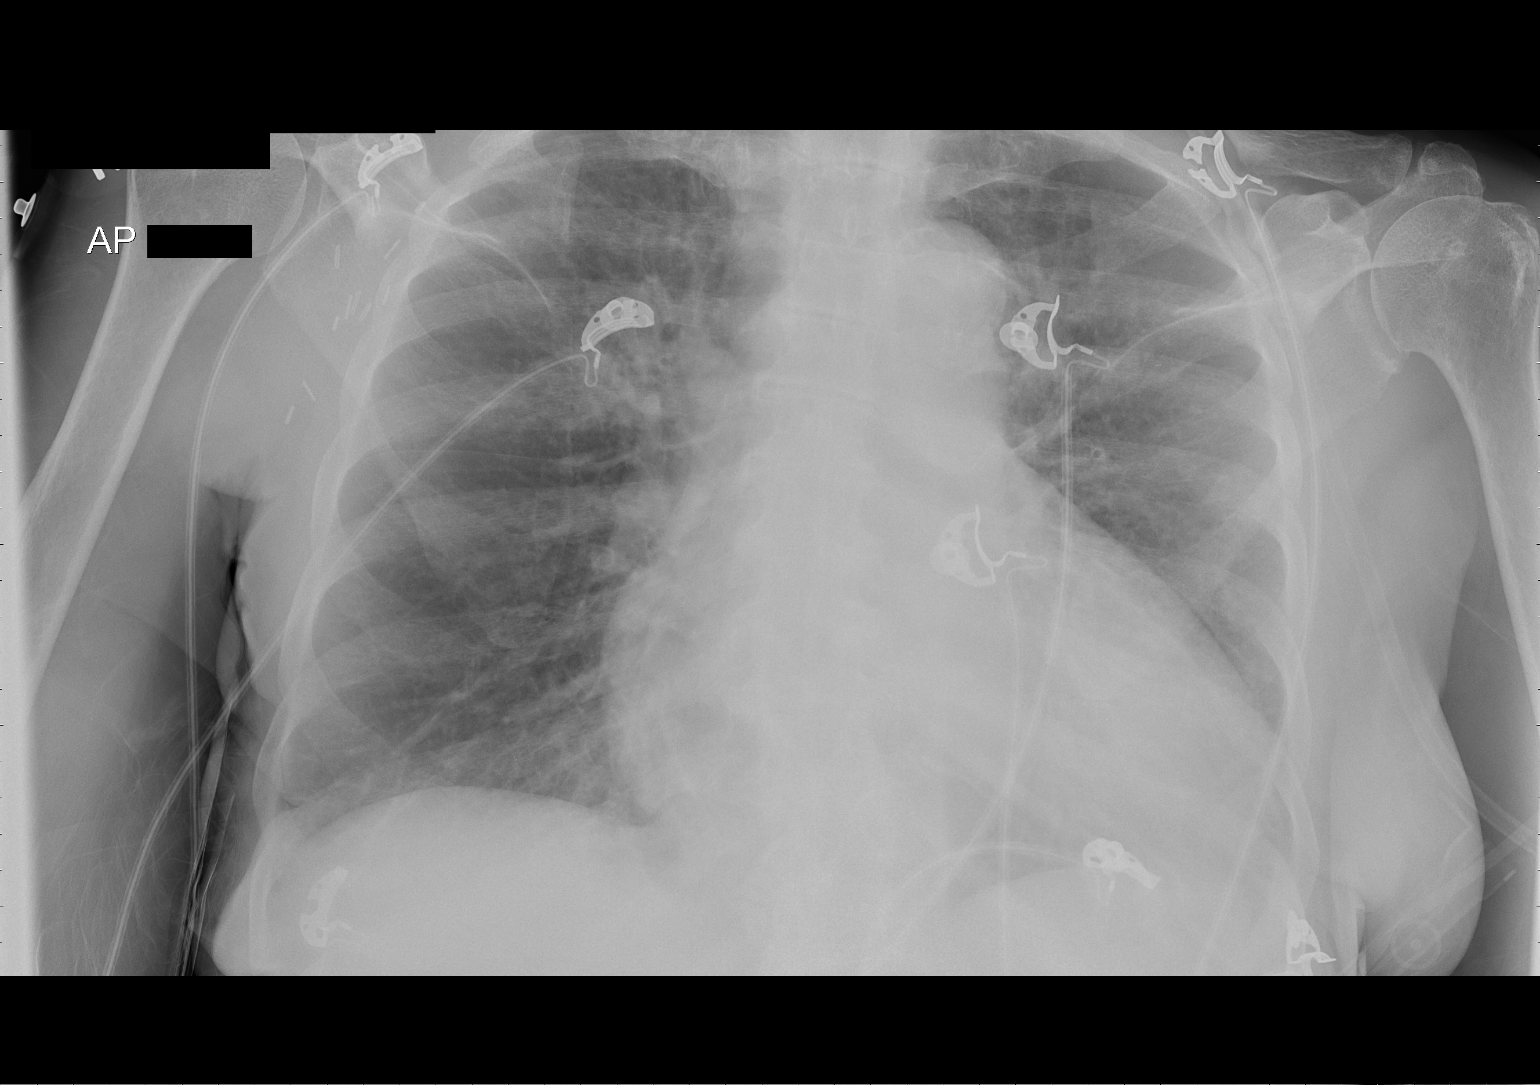

[1 of 1 positions shown; findings below may reference images not displayed]

FINDINGS: Cardiac silhouette is mildly enlarged.  No mediastinal or
hilar masses or adenopathy.  Lungs show prominent bronchovascular
markings, but otherwise clear.  Status post right mastectomy.  Bony
thorax is intact.
IMPRESSION: No acute cardiopulmonary disease.

## 2013-08-15 IMAGING — CR DG CHEST 1V PORT
1 series · 1 of 1 positions shown · non-contrast
Comparison: the previous day's study

CLINICAL DATA: Shortness of breath.

PORTABLE CHEST - 1 VIEW

[AP]
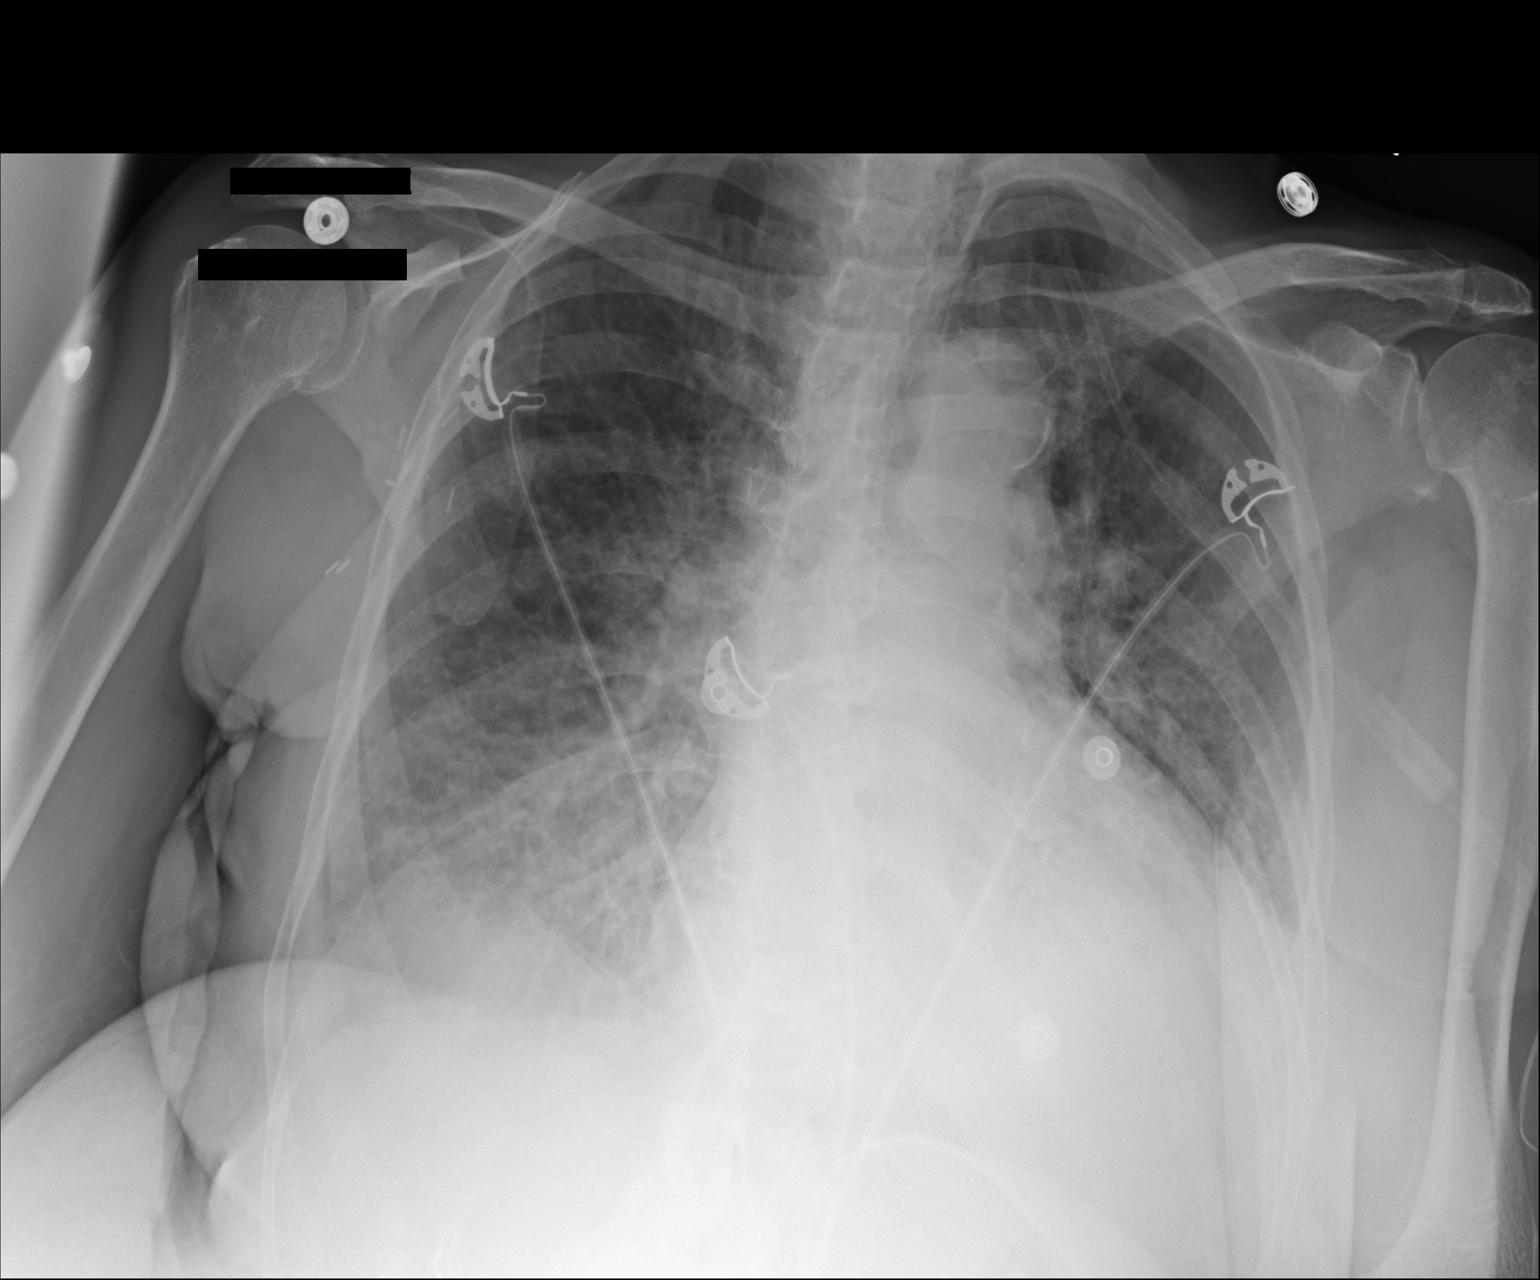

[1 of 1 positions shown; findings below may reference images not displayed]

FINDINGS: Moderate bilateral interstitial edema or infiltrates,
with a predominately central distribution, increased since previous
exam.  Bibasilar airspace opacities, left greater than right,
increased since previous study.  Probable small pleural effusions.
Mild cardiomegaly.  Atheromatous aorta.  Vascular clips in the
right axilla.
IMPRESSION: 1.  Interval worsening of bilateral edema/infiltrates with probable
effusions.

## 2013-09-16 ENCOUNTER — Other Ambulatory Visit: Payer: Self-pay

## 2013-09-16 DIAGNOSIS — Z9011 Acquired absence of right breast and nipple: Secondary | ICD-10-CM

## 2013-09-16 DIAGNOSIS — Z1231 Encounter for screening mammogram for malignant neoplasm of breast: Secondary | ICD-10-CM

## 2013-10-22 ENCOUNTER — Ambulatory Visit
Admission: RE | Admit: 2013-10-22 | Discharge: 2013-10-22 | Disposition: A | Discharge status: Home / Self Care | Payer: Medicare HMO | Source: Ambulatory Visit

## 2013-10-22 DIAGNOSIS — Z1231 Encounter for screening mammogram for malignant neoplasm of breast: Secondary | ICD-10-CM

## 2013-10-22 DIAGNOSIS — Z9011 Acquired absence of right breast and nipple: Secondary | ICD-10-CM

## 2014-04-20 ENCOUNTER — Ambulatory Visit (INDEPENDENT_AMBULATORY_CARE_PROVIDER_SITE_OTHER): Payer: Medicare Other | Admitting: Surgery

## 2014-04-20 ENCOUNTER — Encounter (INDEPENDENT_AMBULATORY_CARE_PROVIDER_SITE_OTHER): Payer: Self-pay | Admitting: Surgery

## 2014-04-20 VITALS — BP 130/88 | HR 64 | Temp 97.6°F | Resp 18 | Ht 59.0 in | Wt 131.6 lb

## 2014-04-20 DIAGNOSIS — Z853 Personal history of malignant neoplasm of breast: Secondary | ICD-10-CM

## 2014-04-20 NOTE — Progress Notes (Signed)
Subjective:     Patient ID: Cynthia Copeland, female   DOB: 09/13/1932, 80 y.o.   MRN: [id number]  HPI Pt sent at the request of Dr Shana ChuteSpruill t recheck right mastectomy incision.  Hx of RMRM 2006 by Dr Lurene ShadowBallen for stage 2 right breast cancer.  Asked to evaluate scar.  Pt noticed mass.  Now it is gone. LAST SAW ONCOLOGY IN 2011.  Mammogram UTD.    Review of Systems  Constitutional: Negative.   HENT: Negative.   Eyes: Negative.   Respiratory: Negative.   Cardiovascular: Negative.   Endocrine: Negative.   Psychiatric/Behavioral: The patient is hyperactive.        Objective:   Physical Exam  Constitutional: She appears well-developed.  HENT:  Head: Normocephalic.  Eyes: No scleral icterus.  Pulmonary/Chest:    Lymphadenopathy:    She has no cervical adenopathy.    She has no axillary adenopathy.       Assessment:     Hx of R MRM 2006 without evidence of recurrence    Plan:     Follow up prn

## 2014-04-20 NOTE — Patient Instructions (Signed)
Return as needed

## 2014-12-10 ENCOUNTER — Other Ambulatory Visit: Payer: Self-pay

## 2014-12-10 DIAGNOSIS — Z1231 Encounter for screening mammogram for malignant neoplasm of breast: Secondary | ICD-10-CM

## 2014-12-16 ENCOUNTER — Ambulatory Visit: Payer: Medicare HMO

## 2017-05-30 ENCOUNTER — Encounter (HOSPITAL_COMMUNITY): Payer: Self-pay | Admitting: Emergency Medicine

## 2017-05-30 ENCOUNTER — Emergency Department (HOSPITAL_COMMUNITY): Payer: Medicare HMO

## 2017-05-30 DIAGNOSIS — S0990XA Unspecified injury of head, initial encounter: Secondary | ICD-10-CM | POA: Diagnosis not present

## 2017-05-30 DIAGNOSIS — Z87891 Personal history of nicotine dependence: Secondary | ICD-10-CM | POA: Diagnosis not present

## 2017-05-30 DIAGNOSIS — I110 Hypertensive heart disease with heart failure: Secondary | ICD-10-CM | POA: Diagnosis not present

## 2017-05-30 DIAGNOSIS — Z853 Personal history of malignant neoplasm of breast: Secondary | ICD-10-CM | POA: Insufficient documentation

## 2017-05-30 DIAGNOSIS — Z79899 Other long term (current) drug therapy: Secondary | ICD-10-CM | POA: Insufficient documentation

## 2017-05-30 DIAGNOSIS — Z96652 Presence of left artificial knee joint: Secondary | ICD-10-CM | POA: Insufficient documentation

## 2017-05-30 DIAGNOSIS — Z23 Encounter for immunization: Secondary | ICD-10-CM | POA: Diagnosis not present

## 2017-05-30 DIAGNOSIS — R51 Headache: Secondary | ICD-10-CM | POA: Diagnosis present

## 2017-05-30 DIAGNOSIS — Y9222 Religious institution as the place of occurrence of the external cause: Secondary | ICD-10-CM | POA: Insufficient documentation

## 2017-05-30 DIAGNOSIS — S0190XA Unspecified open wound of unspecified part of head, initial encounter: Secondary | ICD-10-CM | POA: Diagnosis not present

## 2017-05-30 DIAGNOSIS — Y999 Unspecified external cause status: Secondary | ICD-10-CM | POA: Insufficient documentation

## 2017-05-30 DIAGNOSIS — I5021 Acute systolic (congestive) heart failure: Secondary | ICD-10-CM | POA: Insufficient documentation

## 2017-05-30 DIAGNOSIS — S0181XA Laceration without foreign body of other part of head, initial encounter: Secondary | ICD-10-CM | POA: Diagnosis not present

## 2017-05-30 DIAGNOSIS — S0003XA Contusion of scalp, initial encounter: Secondary | ICD-10-CM | POA: Diagnosis not present

## 2017-05-30 DIAGNOSIS — R600 Localized edema: Secondary | ICD-10-CM | POA: Insufficient documentation

## 2017-05-30 DIAGNOSIS — S0083XA Contusion of other part of head, initial encounter: Secondary | ICD-10-CM | POA: Diagnosis not present

## 2017-05-30 DIAGNOSIS — Y939 Activity, unspecified: Secondary | ICD-10-CM | POA: Diagnosis not present

## 2017-05-30 DIAGNOSIS — W108XXA Fall (on) (from) other stairs and steps, initial encounter: Secondary | ICD-10-CM | POA: Insufficient documentation

## 2017-05-30 NOTE — ED Triage Notes (Addendum)
[facility].  Tripped over last step hitting head on concrete.  Laceration and hematoma to right forehead.  Pt is on eliquis.  Ambulatory at triage.  Denies having any pain. Appears to be some confused.

## 2017-05-31 ENCOUNTER — Emergency Department (HOSPITAL_COMMUNITY): Payer: Medicare HMO

## 2017-05-31 ENCOUNTER — Emergency Department (HOSPITAL_COMMUNITY)
Admission: EM | Admit: 2017-05-31 | Discharge: 2017-05-31 | Disposition: A | Discharge status: Home / Self Care | Payer: Medicare HMO | Attending: Emergency Medicine | Admitting: Emergency Medicine

## 2017-05-31 DIAGNOSIS — S0181XA Laceration without foreign body of other part of head, initial encounter: Secondary | ICD-10-CM | POA: Diagnosis not present

## 2017-05-31 DIAGNOSIS — W19XXXA Unspecified fall, initial encounter: Secondary | ICD-10-CM

## 2017-05-31 MED ORDER — ACETAMINOPHEN 500 MG PO TABS
1000.0000 mg | ORAL_TABLET | Freq: Once | ORAL | Status: AC
  Administered 2017-05-31: 1000 mg via ORAL
  Filled 2017-05-31: qty 2

## 2017-05-31 MED ORDER — TETANUS-DIPHTH-ACELL PERTUSSIS 5-2.5-18.5 LF-MCG/0.5 IM SUSP
0.5000 mL | Freq: Once | INTRAMUSCULAR | Status: AC
  Administered 2017-05-31: 0.5 mL via INTRAMUSCULAR
  Filled 2017-05-31: qty 0.5

## 2017-05-31 NOTE — ED Notes (Signed)
Patient assisted to restroom.  Gait unsteady.  Pt able to toilet independently, but this RN removed in bathroom with her the whole time.  Patient alert and oriented.

## 2017-05-31 NOTE — ED Provider Notes (Signed)
[facility]-EMERGENCY DEPT Provider Note   CSN: [id number] Arrival date & time: 05/30/17  2116     History   Chief Complaint Chief Complaint  Patient presents with  . Laceration  . Fall    HPI Cynthia Copeland is a 83 y.o. female with a past medical history of atrial fibrillation on eloquispresenting today after a fall. Patient was leaving church and she missed a step going down the stairs. She fell and hit the right side of her head. She denies any loss of consciousness. She currently has a headache and pain over the swelling over the right side of her head. She states that she was bleeding everywhere. She denies injury elsewhere. There are no further complaints.  10 Systems reviewed and are negative for acute change except as noted in the HPI.   HPI  Past Medical History:  Diagnosis Date  . Arthritis   . Hypercholesteremia   . Hypertension     Patient Active Problem List   Diagnosis Date Noted  . History of breast cancer 04/20/2014  . Hyperthyroidism 05/16/2012  . Acute respiratory failure (HCC) 05/15/2012  . Acute systolic CHF (congestive heart failure) (HCC) 05/15/2012  . Elevated LFTs 05/15/2012  . Normocytic anemia 05/15/2012  . Atrial fibrillation with RVR (HCC) 05/14/2012  . SOB (shortness of breath) 05/14/2012  . HTN (hypertension) 05/14/2012  . HLD (hyperlipidemia) 05/14/2012  . UTI (lower urinary tract infection) 05/14/2012  . History of GI bleed 05/14/2012    Past Surgical History:  Procedure Laterality Date  . APPENDECTOMY    . BREAST SURGERY  2010   Rt. mastectomy  . JOINT REPLACEMENT  2011   left knee replacement  . TUBAL LIGATION      OB History    No data available       Home Medications    Prior to Admission medications   Medication Sig Start Date End Date Taking? Authorizing Provider  amLODipine (NORVASC) 5 MG tablet Take 5 mg by mouth daily. At noon    [provider]  apixaban (ELIQUIS) 5 MG TABS tablet Take 1 tablet (5 mg  total) by mouth 2 (two) times daily. 05/17/12   Rai, Ripudeep K, MD  cloNIDine (CATAPRES) 0.1 MG tablet Take 0.1 mg by mouth daily. 05/17/12   Rai, Delene Ruffiniipudeep K, MD  Cod Liver Oil CAPS Take 2 capsules by mouth daily.    [provider]  CRANBERRY PO Take 1 tablet by mouth daily.     [provider]  Cyanocobalamin (VITAMIN B 12 PO) Take 1 tablet by mouth daily.    [provider]  methimazole (TAPAZOLE) 10 MG tablet Take 1 tablet (10 mg total) by mouth daily. 05/17/12 04/20/14  Rai, Delene Ruffiniipudeep K, MD  Multiple Vitamin (MULTIVITAMIN WITH MINERALS) TABS Take 1 tablet by mouth daily.    [provider]  Omega-3 Fatty Acids (OMEGA 3 PO) Take 1 capsule by mouth daily.     [provider]  ramipril (ALTACE) 5 MG capsule Take 1 capsule (5 mg total) by mouth daily. 05/17/12 04/20/14  Rai, Delene Ruffiniipudeep K, MD  rosuvastatin (CRESTOR) 10 MG tablet Take 10 mg by mouth daily.    [provider]    Family History No family history on file.  Social History Social History  Substance Use Topics  . Smoking status: Former Smoker    Types: Cigarettes  . Smokeless tobacco: Never Used  . Alcohol use No     Allergies   Penicillins  Review of Systems Review of Systems   Physical Exam Updated Vital Signs BP 125/80   Pulse 99   Temp (!) 97.4 F (36.3 C)   Resp 18   Ht 4\' 11"  (1.499 m)   Wt 59.4 kg (131 lb)   SpO2 99%   BMI 26.46 kg/m   Physical Exam  Constitutional: She is oriented to person, place, and time. She appears well-developed and well-nourished. No distress.  HENT:  Head: Normocephalic.  Nose: Nose normal.  Mouth/Throat: Oropharynx is clear and moist. No oropharyngeal exudate.  Swelling seen over the right orbit. Abrasion noted. No laceration. No active bleeding. There is tenderness to palpation over the hematoma.  Eyes: Pupils are equal, round, and reactive to light. Conjunctivae and EOM are normal. No scleral icterus.  Neck: Normal range  of motion. Neck supple. No JVD present. No tracheal deviation present. No thyromegaly present.  Cardiovascular: Normal rate, regular rhythm and normal heart sounds.  Exam reveals no gallop and no friction rub.   No murmur heard. Pulmonary/Chest: Effort normal and breath sounds normal. No respiratory distress. She has no wheezes. She exhibits no tenderness.  Abdominal: Soft. Bowel sounds are normal. She exhibits no distension and no mass. There is no tenderness. There is no rebound and no guarding.  Musculoskeletal: Normal range of motion. She exhibits no edema or tenderness.  Lymphadenopathy:    She has no cervical adenopathy.  Neurological: She is alert and oriented to person, place, and time. No cranial nerve deficit. She exhibits normal muscle tone.  ormal strength and sensation in ostomy. Normal cerebellar testing. Normal gait.  Skin: Skin is warm and dry. No rash noted. No erythema. No pallor.  Nursing note and vitals reviewed.    ED Treatments / Results  Labs (all labs ordered are listed, but only abnormal results are displayed) Labs Reviewed - No data to display  EKG  EKG Interpretation None       Radiology Ct Head Wo Contrast  Result Date: 05/30/2017 CLINICAL DATA:  Patient tripped hitting head on concrete. EXAM: CT HEAD WITHOUT CONTRAST TECHNIQUE: Contiguous axial images were obtained from the base of the skull through the vertex without intravenous contrast. COMPARISON:  09/06/2004 FINDINGS: Brain: Chronic right caudate lacunar infarct. Chronic small vessel ischemic disease of periventricular white matter. No acute intracranial hemorrhage, large vascular territory infarct, intra-axial mass nor extra-axial fluid. Vascular: Moderate atherosclerosis of the carotid siphons. No hyperdense vessels. Skull: No skull fracture.  No suspicious osseous lesions. Sinuses/Orbits: Right maxillary sinus wall thickening consistent with stigmata of chronic sinusitis. Small amount of mucosal  thickening is seen posteriorly. No acute paranasal sinus disease. Other: Moderate right frontal scalp hematoma. IMPRESSION: 1. Right frontal scalp hematoma without underlying fracture. 2. Chronic microvascular ischemic disease of periventricular white matter. 3. Chronic right caudate head lacunar infarct. Electronically Signed   By: Tollie Eth M.D.   On: 05/30/2017 21:47   Ct Maxillofacial Wo Contrast  Result Date: 05/31/2017 CLINICAL DATA:  Fall with laceration and hematoma to the right forehead EXAM: CT MAXILLOFACIAL WITHOUT CONTRAST TECHNIQUE: Multidetector CT imaging of the maxillofacial structures was performed. Multiplanar CT image reconstructions were also generated. COMPARISON:  05/30/2017 head CT FINDINGS: Osseous: Bilateral mandibular heads are normally position. Negative for mandibular fracture. Mastoid air cells are clear. Zygomatic arches, pterygoid plates and nasal bones appear intact. Orbits: Negative. No traumatic or inflammatory finding. Sinuses: Mucosal thickening in the right maxillary sinus. No acute fluid levels. No sinus wall fracture Soft tissues: Mild right periorbital  soft tissue hematoma and swelling over the right forehead. Limited intracranial: No significant or unexpected finding. IMPRESSION: 1. No definite acute displaced facial bone fracture 2. Mild right periorbital and forehead hematoma. Electronically Signed   By: Jasmine Pang M.D.   On: 05/31/2017 03:28    Procedures Procedures (including critical care time)  Medications Ordered in ED Medications  Tdap (BOOSTRIX) injection 0.5 mL (0.5 mLs Intramuscular Given 05/31/17 0207)  acetaminophen (TYLENOL) tablet 1,000 mg (1,000 mg Oral Given 05/31/17 0206)     Initial Impression / Assessment and Plan / ED Course  I have reviewed the triage vital signs and the nursing notes.  Pertinent labs & imaging results that were available during my care of the patient were reviewed by me and considered in my medical decision making  (see chart for details).     Patient presents to the emergency department for a fall. CT scan of the head ordered by triage is negative for bleed. Tetanus shot was updated. Will order CT scan of the face to evaluate for any fractures of the facial bones. She is given Tylenol and ice pack.  5:16 AM CT neg for injury.  Patient safe for DC home.  Instructions given regarding ice packs at home to help with swelling and tylenol for pain.  PCP fu advised within 3 days. She appears well and in NAD. VS remain within her normal limits and she is safe for DC.  Final Clinical Impressions(s) / ED Diagnoses   Final diagnoses:  Fall, initial encounter    New Prescriptions New Prescriptions   No medications on file     Tomasita Crumble, MD 05/31/17 [phone]

## 2017-05-31 NOTE — ED Notes (Signed)
[facility] called for transport.  

## 2017-06-01 DIAGNOSIS — I5020 Unspecified systolic (congestive) heart failure: Secondary | ICD-10-CM | POA: Diagnosis not present

## 2017-06-01 DIAGNOSIS — E0590 Thyrotoxicosis, unspecified without thyrotoxic crisis or storm: Secondary | ICD-10-CM | POA: Diagnosis not present

## 2017-06-01 DIAGNOSIS — E785 Hyperlipidemia, unspecified: Secondary | ICD-10-CM | POA: Diagnosis not present

## 2017-06-01 DIAGNOSIS — I119 Hypertensive heart disease without heart failure: Secondary | ICD-10-CM | POA: Diagnosis not present

## 2017-06-01 DIAGNOSIS — M1990 Unspecified osteoarthritis, unspecified site: Secondary | ICD-10-CM | POA: Diagnosis not present

## 2017-06-01 DIAGNOSIS — I10 Essential (primary) hypertension: Secondary | ICD-10-CM | POA: Diagnosis not present

## 2017-06-01 DIAGNOSIS — I480 Paroxysmal atrial fibrillation: Secondary | ICD-10-CM | POA: Diagnosis not present

## 2017-10-11 DIAGNOSIS — I482 Chronic atrial fibrillation: Secondary | ICD-10-CM | POA: Diagnosis not present

## 2017-10-11 DIAGNOSIS — I119 Hypertensive heart disease without heart failure: Secondary | ICD-10-CM | POA: Diagnosis not present

## 2017-10-11 DIAGNOSIS — I5020 Unspecified systolic (congestive) heart failure: Secondary | ICD-10-CM | POA: Diagnosis not present

## 2017-10-11 DIAGNOSIS — M1990 Unspecified osteoarthritis, unspecified site: Secondary | ICD-10-CM | POA: Diagnosis not present

## 2017-10-11 DIAGNOSIS — E785 Hyperlipidemia, unspecified: Secondary | ICD-10-CM | POA: Diagnosis not present

## 2017-12-19 DIAGNOSIS — M1990 Unspecified osteoarthritis, unspecified site: Secondary | ICD-10-CM | POA: Diagnosis not present

## 2017-12-19 DIAGNOSIS — I480 Paroxysmal atrial fibrillation: Secondary | ICD-10-CM | POA: Diagnosis not present

## 2017-12-19 DIAGNOSIS — I5020 Unspecified systolic (congestive) heart failure: Secondary | ICD-10-CM | POA: Diagnosis not present

## 2017-12-19 DIAGNOSIS — E785 Hyperlipidemia, unspecified: Secondary | ICD-10-CM | POA: Diagnosis not present

## 2017-12-19 DIAGNOSIS — I119 Hypertensive heart disease without heart failure: Secondary | ICD-10-CM | POA: Diagnosis not present

## 2018-02-26 ENCOUNTER — Encounter (HOSPITAL_COMMUNITY): Payer: Self-pay

## 2018-02-26 ENCOUNTER — Emergency Department (HOSPITAL_COMMUNITY): Payer: Medicare HMO

## 2018-02-26 ENCOUNTER — Emergency Department (HOSPITAL_COMMUNITY)
Admission: EM | Admit: 2018-02-26 | Discharge: 2018-02-26 | Disposition: A | Discharge status: Home / Self Care | Payer: Medicare HMO | Attending: Emergency Medicine | Admitting: Emergency Medicine

## 2018-02-26 ENCOUNTER — Other Ambulatory Visit: Payer: Self-pay

## 2018-02-26 DIAGNOSIS — M25552 Pain in left hip: Secondary | ICD-10-CM | POA: Diagnosis not present

## 2018-02-26 DIAGNOSIS — M25512 Pain in left shoulder: Secondary | ICD-10-CM | POA: Diagnosis not present

## 2018-02-26 DIAGNOSIS — M25511 Pain in right shoulder: Secondary | ICD-10-CM | POA: Insufficient documentation

## 2018-02-26 DIAGNOSIS — S79912A Unspecified injury of left hip, initial encounter: Secondary | ICD-10-CM | POA: Diagnosis not present

## 2018-02-26 DIAGNOSIS — F0390 Unspecified dementia without behavioral disturbance: Secondary | ICD-10-CM | POA: Insufficient documentation

## 2018-02-26 DIAGNOSIS — R410 Disorientation, unspecified: Secondary | ICD-10-CM

## 2018-02-26 LAB — CBC WITH DIFFERENTIAL/PLATELET
Abs Immature Granulocytes: 0 10*3/uL (ref 0.0–0.1)
BASOS ABS: 0 10*3/uL (ref 0.0–0.1)
Basophils Relative: 1 %
EOS PCT: 5 %
Eosinophils Absolute: 0.2 10*3/uL (ref 0.0–0.7)
HCT: 32.2 % — ABNORMAL LOW (ref 36.0–46.0)
HEMOGLOBIN: 10 g/dL — AB (ref 12.0–15.0)
Immature Granulocytes: 0 %
Lymphocytes Relative: 26 %
Lymphs Abs: 0.9 10*3/uL (ref 0.7–4.0)
MCH: 27.1 pg (ref 26.0–34.0)
MCHC: 31.1 g/dL (ref 30.0–36.0)
MCV: 87.3 fL (ref 78.0–100.0)
Monocytes Absolute: 0.4 10*3/uL (ref 0.1–1.0)
Monocytes Relative: 12 %
NEUTROS PCT: 56 %
Neutro Abs: 1.9 10*3/uL (ref 1.7–7.7)
Platelets: 177 10*3/uL (ref 150–400)
RBC: 3.69 MIL/uL — AB (ref 3.87–5.11)
RDW: 14.3 % (ref 11.5–15.5)
WBC: 3.3 10*3/uL — AB (ref 4.0–10.5)

## 2018-02-26 LAB — BASIC METABOLIC PANEL
Anion gap: 8 (ref 5–15)
BUN: 22 mg/dL — ABNORMAL HIGH (ref 6–20)
CO2: 26 mmol/L (ref 22–32)
Calcium: 9.1 mg/dL (ref 8.9–10.3)
Chloride: 109 mmol/L (ref 101–111)
Creatinine, Ser: 0.98 mg/dL (ref 0.44–1.00)
GFR calc Af Amer: 60 mL/min — ABNORMAL LOW (ref >60)
GFR calc non Af Amer: 52 mL/min — ABNORMAL LOW (ref >60)
Glucose, Bld: 85 mg/dL (ref 65–99)
Potassium: 3.5 mmol/L (ref 3.5–5.1)
Sodium: 143 mmol/L (ref 135–145)

## 2018-02-26 MED ORDER — ACETAMINOPHEN 325 MG PO TABS: 650.0000 mg | ORAL_TABLET | Freq: Once | ORAL | Status: DC

## 2018-02-26 NOTE — ED Notes (Signed)
Pt grandson signed AMA form

## 2018-02-26 NOTE — ED Triage Notes (Signed)
Pt seems confused in triage, poor historian and no one with her. She states that she was driven by a friend and dropped off today. Pt reports she had a fall when trying to sit down and was not able to get up. Pt seems confused on the day she fell stating Sunday and Wednesday. C/O "shoulder pain down to hip and feet on whole left side"

## 2018-02-26 NOTE — ED Provider Notes (Signed)
Patient's brother is at bedside, stating he is taking patient home because of the wait. He is stating that her primary care can just call for the results. When asked if patient is at her mental baseline, he states she's "good enough." States she has hx of dementia and is intermittently confused. Discussed that patient has not been fully evaluated yet and has still not received CT scan. Discussed risks of leaving with both the patient and family and recommended they stay for further evaluation. Patient requesting to leave as well.   Discussed family and patient's request to leave with Dr. Anitra Lauth.  We discussed the nature and purpose, risks and benefits, as well as, the alternatives of treatment. Time was given to allow the opportunity to ask questions and consider their options, and after the discussion, the patient decided to refuse the offerred treatment. The patient was informed that refusal could lead to, but was not limited to, death, permanent disability, or severe pain.      Jacquees Gongora, [place], PA-C 02/26/18 1948    Gwyneth Sprout, MD 03/04/18 2107

## 2018-02-26 NOTE — ED Notes (Signed)
Pt grandson states he wants to take the pt home, EDP at bedside explaining risks of leaving and he still wants to take her.

## 2018-02-26 NOTE — ED Provider Notes (Cosign Needed)
Patient placed in Quick Look pathway, seen and evaluated   Chief Complaint: fall, right side pain  HPI:   Pt presenting to the ED complaining of pain to her right side that began after a fall on Wednesday. History is somewhat difficult to obtain, patient tangential with history. She states she did not hit her head when she fell. Reports pain is on her entire left side from her shoulder to her feet.   ROS: left side pain  Physical Exam:   Gen: No distress  Neuro: Awake and Alert  Skin: Warm    Focused Exam:  Pt is alert and oriented x3. PERRL, symmetric grip strength BUE, though unable to keep her attention to complete neuro exam. Patient mumbling. Left shoulder appears atraumatic without pain with PROM. Left hip passively internally and externally rotated and pt did not express pain. LLE appears atraumatic.  RN called patient's family contact in the chart. Pt's family member spoke with patient on the phone in triage, and reports that her speech is normal in that it is slowed and slurred, though she sounds more confused than normal with an unclear story. She reports she will contact patient's daughter to accompany her in the ED. Pt will be monitored in the hallway in triage.  Initiation of care has begun. The patient has been counseled on the process, plan, and necessity for staying for the completion/evaluation, and the remainder of the medical screening examination    Ramiel Forti, [place], PA-C 02/26/18 1748

## 2018-03-01 DIAGNOSIS — I5020 Unspecified systolic (congestive) heart failure: Secondary | ICD-10-CM | POA: Diagnosis not present

## 2018-03-01 DIAGNOSIS — I480 Paroxysmal atrial fibrillation: Secondary | ICD-10-CM | POA: Diagnosis not present

## 2018-03-01 DIAGNOSIS — I10 Essential (primary) hypertension: Secondary | ICD-10-CM | POA: Diagnosis not present

## 2018-03-01 DIAGNOSIS — M1990 Unspecified osteoarthritis, unspecified site: Secondary | ICD-10-CM | POA: Diagnosis not present

## 2018-03-01 DIAGNOSIS — E785 Hyperlipidemia, unspecified: Secondary | ICD-10-CM | POA: Diagnosis not present

## 2018-05-20 DIAGNOSIS — E0590 Thyrotoxicosis, unspecified without thyrotoxic crisis or storm: Secondary | ICD-10-CM | POA: Diagnosis not present

## 2018-05-20 DIAGNOSIS — I5020 Unspecified systolic (congestive) heart failure: Secondary | ICD-10-CM | POA: Diagnosis not present

## 2018-05-20 DIAGNOSIS — I480 Paroxysmal atrial fibrillation: Secondary | ICD-10-CM | POA: Diagnosis not present

## 2018-05-20 DIAGNOSIS — I10 Essential (primary) hypertension: Secondary | ICD-10-CM | POA: Diagnosis not present

## 2018-05-20 DIAGNOSIS — R6889 Other general symptoms and signs: Secondary | ICD-10-CM | POA: Diagnosis not present

## 2018-05-20 DIAGNOSIS — M1990 Unspecified osteoarthritis, unspecified site: Secondary | ICD-10-CM | POA: Diagnosis not present

## 2018-05-20 DIAGNOSIS — E785 Hyperlipidemia, unspecified: Secondary | ICD-10-CM | POA: Diagnosis not present

## 2018-05-28 DIAGNOSIS — I110 Hypertensive heart disease with heart failure: Secondary | ICD-10-CM | POA: Diagnosis not present

## 2018-05-28 DIAGNOSIS — Z7901 Long term (current) use of anticoagulants: Secondary | ICD-10-CM | POA: Diagnosis not present

## 2018-05-28 DIAGNOSIS — M1990 Unspecified osteoarthritis, unspecified site: Secondary | ICD-10-CM | POA: Diagnosis not present

## 2018-05-28 DIAGNOSIS — I5020 Unspecified systolic (congestive) heart failure: Secondary | ICD-10-CM | POA: Diagnosis not present

## 2018-05-28 DIAGNOSIS — E785 Hyperlipidemia, unspecified: Secondary | ICD-10-CM | POA: Diagnosis not present

## 2018-05-28 DIAGNOSIS — R296 Repeated falls: Secondary | ICD-10-CM | POA: Diagnosis not present

## 2018-05-28 DIAGNOSIS — I480 Paroxysmal atrial fibrillation: Secondary | ICD-10-CM | POA: Diagnosis not present

## 2018-05-29 DIAGNOSIS — I5020 Unspecified systolic (congestive) heart failure: Secondary | ICD-10-CM | POA: Diagnosis not present

## 2018-05-29 DIAGNOSIS — M1990 Unspecified osteoarthritis, unspecified site: Secondary | ICD-10-CM | POA: Diagnosis not present

## 2018-05-29 DIAGNOSIS — R531 Weakness: Secondary | ICD-10-CM | POA: Diagnosis not present

## 2018-06-03 DIAGNOSIS — M1990 Unspecified osteoarthritis, unspecified site: Secondary | ICD-10-CM | POA: Diagnosis not present

## 2018-06-03 DIAGNOSIS — Z7901 Long term (current) use of anticoagulants: Secondary | ICD-10-CM | POA: Diagnosis not present

## 2018-06-03 DIAGNOSIS — R296 Repeated falls: Secondary | ICD-10-CM | POA: Diagnosis not present

## 2018-06-03 DIAGNOSIS — I480 Paroxysmal atrial fibrillation: Secondary | ICD-10-CM | POA: Diagnosis not present

## 2018-06-03 DIAGNOSIS — E785 Hyperlipidemia, unspecified: Secondary | ICD-10-CM | POA: Diagnosis not present

## 2018-06-03 DIAGNOSIS — I110 Hypertensive heart disease with heart failure: Secondary | ICD-10-CM | POA: Diagnosis not present

## 2018-06-03 DIAGNOSIS — I5020 Unspecified systolic (congestive) heart failure: Secondary | ICD-10-CM | POA: Diagnosis not present

## 2018-06-05 DIAGNOSIS — Z7901 Long term (current) use of anticoagulants: Secondary | ICD-10-CM | POA: Diagnosis not present

## 2018-06-05 DIAGNOSIS — E785 Hyperlipidemia, unspecified: Secondary | ICD-10-CM | POA: Diagnosis not present

## 2018-06-05 DIAGNOSIS — I480 Paroxysmal atrial fibrillation: Secondary | ICD-10-CM | POA: Diagnosis not present

## 2018-06-05 DIAGNOSIS — R296 Repeated falls: Secondary | ICD-10-CM | POA: Diagnosis not present

## 2018-06-05 DIAGNOSIS — I5020 Unspecified systolic (congestive) heart failure: Secondary | ICD-10-CM | POA: Diagnosis not present

## 2018-06-05 DIAGNOSIS — M1990 Unspecified osteoarthritis, unspecified site: Secondary | ICD-10-CM | POA: Diagnosis not present

## 2018-06-05 DIAGNOSIS — I110 Hypertensive heart disease with heart failure: Secondary | ICD-10-CM | POA: Diagnosis not present

## 2018-06-06 DIAGNOSIS — E785 Hyperlipidemia, unspecified: Secondary | ICD-10-CM | POA: Diagnosis not present

## 2018-06-06 DIAGNOSIS — R296 Repeated falls: Secondary | ICD-10-CM | POA: Diagnosis not present

## 2018-06-06 DIAGNOSIS — I5020 Unspecified systolic (congestive) heart failure: Secondary | ICD-10-CM | POA: Diagnosis not present

## 2018-06-06 DIAGNOSIS — I110 Hypertensive heart disease with heart failure: Secondary | ICD-10-CM | POA: Diagnosis not present

## 2018-06-06 DIAGNOSIS — I480 Paroxysmal atrial fibrillation: Secondary | ICD-10-CM | POA: Diagnosis not present

## 2018-06-06 DIAGNOSIS — M1990 Unspecified osteoarthritis, unspecified site: Secondary | ICD-10-CM | POA: Diagnosis not present

## 2018-06-06 DIAGNOSIS — Z7901 Long term (current) use of anticoagulants: Secondary | ICD-10-CM | POA: Diagnosis not present

## 2018-06-07 DIAGNOSIS — R296 Repeated falls: Secondary | ICD-10-CM | POA: Diagnosis not present

## 2018-06-07 DIAGNOSIS — E785 Hyperlipidemia, unspecified: Secondary | ICD-10-CM | POA: Diagnosis not present

## 2018-06-07 DIAGNOSIS — Z7901 Long term (current) use of anticoagulants: Secondary | ICD-10-CM | POA: Diagnosis not present

## 2018-06-07 DIAGNOSIS — I5020 Unspecified systolic (congestive) heart failure: Secondary | ICD-10-CM | POA: Diagnosis not present

## 2018-06-07 DIAGNOSIS — M1990 Unspecified osteoarthritis, unspecified site: Secondary | ICD-10-CM | POA: Diagnosis not present

## 2018-06-07 DIAGNOSIS — I110 Hypertensive heart disease with heart failure: Secondary | ICD-10-CM | POA: Diagnosis not present

## 2018-06-07 DIAGNOSIS — I480 Paroxysmal atrial fibrillation: Secondary | ICD-10-CM | POA: Diagnosis not present

## 2018-06-12 DIAGNOSIS — R296 Repeated falls: Secondary | ICD-10-CM | POA: Diagnosis not present

## 2018-06-12 DIAGNOSIS — I480 Paroxysmal atrial fibrillation: Secondary | ICD-10-CM | POA: Diagnosis not present

## 2018-06-12 DIAGNOSIS — M1990 Unspecified osteoarthritis, unspecified site: Secondary | ICD-10-CM | POA: Diagnosis not present

## 2018-06-12 DIAGNOSIS — E785 Hyperlipidemia, unspecified: Secondary | ICD-10-CM | POA: Diagnosis not present

## 2018-06-12 DIAGNOSIS — Z7901 Long term (current) use of anticoagulants: Secondary | ICD-10-CM | POA: Diagnosis not present

## 2018-06-12 DIAGNOSIS — I110 Hypertensive heart disease with heart failure: Secondary | ICD-10-CM | POA: Diagnosis not present

## 2018-06-12 DIAGNOSIS — I5020 Unspecified systolic (congestive) heart failure: Secondary | ICD-10-CM | POA: Diagnosis not present

## 2018-06-13 DIAGNOSIS — I480 Paroxysmal atrial fibrillation: Secondary | ICD-10-CM | POA: Diagnosis not present

## 2018-06-13 DIAGNOSIS — Z7901 Long term (current) use of anticoagulants: Secondary | ICD-10-CM | POA: Diagnosis not present

## 2018-06-13 DIAGNOSIS — M1990 Unspecified osteoarthritis, unspecified site: Secondary | ICD-10-CM | POA: Diagnosis not present

## 2018-06-13 DIAGNOSIS — I5020 Unspecified systolic (congestive) heart failure: Secondary | ICD-10-CM | POA: Diagnosis not present

## 2018-06-13 DIAGNOSIS — I110 Hypertensive heart disease with heart failure: Secondary | ICD-10-CM | POA: Diagnosis not present

## 2018-06-13 DIAGNOSIS — R296 Repeated falls: Secondary | ICD-10-CM | POA: Diagnosis not present

## 2018-06-13 DIAGNOSIS — E785 Hyperlipidemia, unspecified: Secondary | ICD-10-CM | POA: Diagnosis not present

## 2018-06-17 DIAGNOSIS — M25562 Pain in left knee: Secondary | ICD-10-CM | POA: Diagnosis not present

## 2018-06-17 DIAGNOSIS — M25561 Pain in right knee: Secondary | ICD-10-CM | POA: Diagnosis not present

## 2018-06-19 DIAGNOSIS — E785 Hyperlipidemia, unspecified: Secondary | ICD-10-CM | POA: Diagnosis not present

## 2018-06-19 DIAGNOSIS — I480 Paroxysmal atrial fibrillation: Secondary | ICD-10-CM | POA: Diagnosis not present

## 2018-06-19 DIAGNOSIS — R296 Repeated falls: Secondary | ICD-10-CM | POA: Diagnosis not present

## 2018-06-19 DIAGNOSIS — M1990 Unspecified osteoarthritis, unspecified site: Secondary | ICD-10-CM | POA: Diagnosis not present

## 2018-06-19 DIAGNOSIS — I110 Hypertensive heart disease with heart failure: Secondary | ICD-10-CM | POA: Diagnosis not present

## 2018-06-19 DIAGNOSIS — Z7901 Long term (current) use of anticoagulants: Secondary | ICD-10-CM | POA: Diagnosis not present

## 2018-06-19 DIAGNOSIS — I5020 Unspecified systolic (congestive) heart failure: Secondary | ICD-10-CM | POA: Diagnosis not present

## 2018-06-25 DIAGNOSIS — I5020 Unspecified systolic (congestive) heart failure: Secondary | ICD-10-CM | POA: Diagnosis not present

## 2018-06-25 DIAGNOSIS — E785 Hyperlipidemia, unspecified: Secondary | ICD-10-CM | POA: Diagnosis not present

## 2018-06-25 DIAGNOSIS — R296 Repeated falls: Secondary | ICD-10-CM | POA: Diagnosis not present

## 2018-06-25 DIAGNOSIS — M1990 Unspecified osteoarthritis, unspecified site: Secondary | ICD-10-CM | POA: Diagnosis not present

## 2018-06-25 DIAGNOSIS — I110 Hypertensive heart disease with heart failure: Secondary | ICD-10-CM | POA: Diagnosis not present

## 2018-06-25 DIAGNOSIS — I480 Paroxysmal atrial fibrillation: Secondary | ICD-10-CM | POA: Diagnosis not present

## 2018-06-25 DIAGNOSIS — Z7901 Long term (current) use of anticoagulants: Secondary | ICD-10-CM | POA: Diagnosis not present

## 2018-06-26 DIAGNOSIS — E785 Hyperlipidemia, unspecified: Secondary | ICD-10-CM | POA: Diagnosis not present

## 2018-06-26 DIAGNOSIS — Z7901 Long term (current) use of anticoagulants: Secondary | ICD-10-CM | POA: Diagnosis not present

## 2018-06-26 DIAGNOSIS — R296 Repeated falls: Secondary | ICD-10-CM | POA: Diagnosis not present

## 2018-06-26 DIAGNOSIS — I110 Hypertensive heart disease with heart failure: Secondary | ICD-10-CM | POA: Diagnosis not present

## 2018-06-26 DIAGNOSIS — I480 Paroxysmal atrial fibrillation: Secondary | ICD-10-CM | POA: Diagnosis not present

## 2018-06-26 DIAGNOSIS — I5020 Unspecified systolic (congestive) heart failure: Secondary | ICD-10-CM | POA: Diagnosis not present

## 2018-06-26 DIAGNOSIS — M1990 Unspecified osteoarthritis, unspecified site: Secondary | ICD-10-CM | POA: Diagnosis not present

## 2018-07-01 DIAGNOSIS — M1990 Unspecified osteoarthritis, unspecified site: Secondary | ICD-10-CM | POA: Diagnosis not present

## 2018-07-01 DIAGNOSIS — I5020 Unspecified systolic (congestive) heart failure: Secondary | ICD-10-CM | POA: Diagnosis not present

## 2018-07-01 DIAGNOSIS — I110 Hypertensive heart disease with heart failure: Secondary | ICD-10-CM | POA: Diagnosis not present

## 2018-07-01 DIAGNOSIS — I480 Paroxysmal atrial fibrillation: Secondary | ICD-10-CM | POA: Diagnosis not present

## 2018-07-01 DIAGNOSIS — Z7901 Long term (current) use of anticoagulants: Secondary | ICD-10-CM | POA: Diagnosis not present

## 2018-07-01 DIAGNOSIS — E785 Hyperlipidemia, unspecified: Secondary | ICD-10-CM | POA: Diagnosis not present

## 2018-07-01 DIAGNOSIS — R296 Repeated falls: Secondary | ICD-10-CM | POA: Diagnosis not present

## 2018-07-04 ENCOUNTER — Encounter: Payer: Self-pay | Admitting: *Deleted

## 2018-07-04 ENCOUNTER — Ambulatory Visit: Payer: Medicare HMO | Admitting: Neurology

## 2018-07-04 ENCOUNTER — Telehealth: Payer: Self-pay | Admitting: *Deleted

## 2018-07-04 NOTE — Telephone Encounter (Signed)
Patient's grandson called at 3:25pm to cancel 3:30pm new patient appt. Stated the patient was confused today and could not get dressed in time for her appt.

## 2018-07-05 ENCOUNTER — Encounter: Payer: Self-pay | Admitting: Neurology

## 2018-07-22 DIAGNOSIS — M1990 Unspecified osteoarthritis, unspecified site: Secondary | ICD-10-CM | POA: Diagnosis not present

## 2018-07-22 DIAGNOSIS — E785 Hyperlipidemia, unspecified: Secondary | ICD-10-CM | POA: Diagnosis not present

## 2018-07-22 DIAGNOSIS — I480 Paroxysmal atrial fibrillation: Secondary | ICD-10-CM | POA: Diagnosis not present

## 2018-07-22 DIAGNOSIS — I5020 Unspecified systolic (congestive) heart failure: Secondary | ICD-10-CM | POA: Diagnosis not present

## 2018-07-22 DIAGNOSIS — Z7901 Long term (current) use of anticoagulants: Secondary | ICD-10-CM | POA: Diagnosis not present

## 2018-07-22 DIAGNOSIS — R296 Repeated falls: Secondary | ICD-10-CM | POA: Diagnosis not present

## 2018-07-22 DIAGNOSIS — I110 Hypertensive heart disease with heart failure: Secondary | ICD-10-CM | POA: Diagnosis not present

## 2018-07-24 DIAGNOSIS — I5020 Unspecified systolic (congestive) heart failure: Secondary | ICD-10-CM | POA: Diagnosis not present

## 2018-07-24 DIAGNOSIS — M1990 Unspecified osteoarthritis, unspecified site: Secondary | ICD-10-CM | POA: Diagnosis not present

## 2018-07-24 DIAGNOSIS — E0590 Thyrotoxicosis, unspecified without thyrotoxic crisis or storm: Secondary | ICD-10-CM | POA: Diagnosis not present

## 2018-07-24 DIAGNOSIS — I10 Essential (primary) hypertension: Secondary | ICD-10-CM | POA: Diagnosis not present

## 2018-07-24 DIAGNOSIS — I4891 Unspecified atrial fibrillation: Secondary | ICD-10-CM | POA: Diagnosis not present

## 2018-07-24 DIAGNOSIS — E785 Hyperlipidemia, unspecified: Secondary | ICD-10-CM | POA: Diagnosis not present

## 2018-08-23 DIAGNOSIS — I4891 Unspecified atrial fibrillation: Secondary | ICD-10-CM | POA: Diagnosis not present

## 2018-08-23 DIAGNOSIS — E0590 Thyrotoxicosis, unspecified without thyrotoxic crisis or storm: Secondary | ICD-10-CM | POA: Diagnosis not present

## 2018-08-23 DIAGNOSIS — I10 Essential (primary) hypertension: Secondary | ICD-10-CM | POA: Diagnosis not present

## 2018-08-23 DIAGNOSIS — E785 Hyperlipidemia, unspecified: Secondary | ICD-10-CM | POA: Diagnosis not present

## 2018-08-31 IMAGING — CT CT MAXILLOFACIAL W/O CM
3 series · 16 of 47 positions shown, 19 images · non-contrast
Comparison: 05/30/2017 head CT

CLINICAL DATA: Fall with laceration and hematoma to the right
forehead

EXAM:
CT MAXILLOFACIAL WITHOUT CONTRAST
TECHNIQUE: Multidetector CT imaging of the maxillofacial structures was
performed. Multiplanar CT image reconstructions were also generated.

[Series 3: facialbone 2.0 st · axial · 0.36mm/px · z∈[-227,-81]mm · 10 of 85 slices shown, 13 images]
[im 6/85  brain]
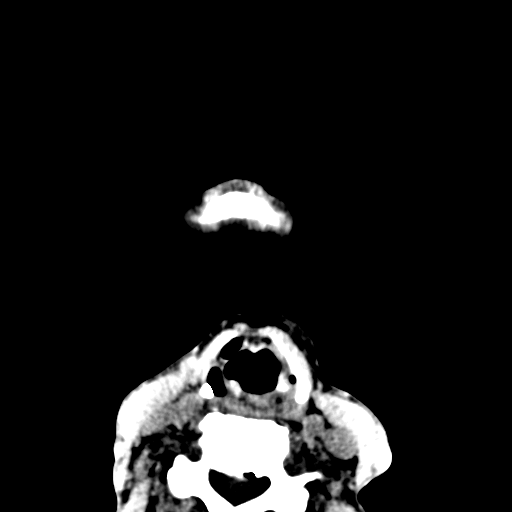
[im 6/85  bone]
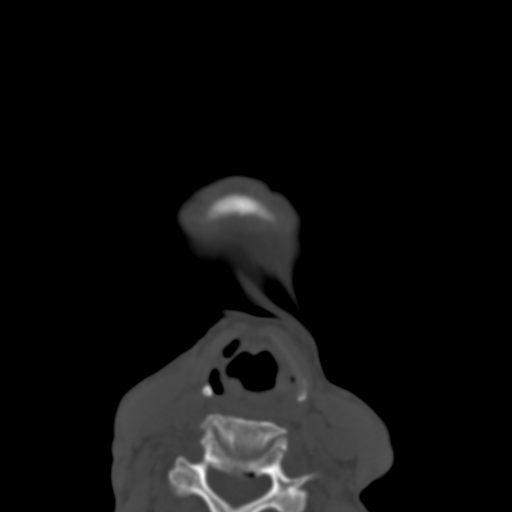
[im 15/85  bone]
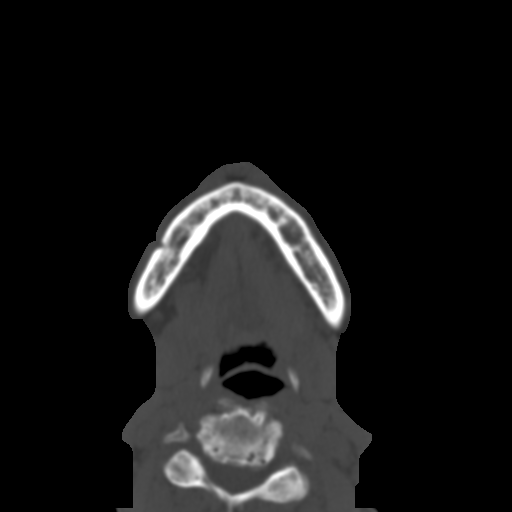
[im 24/85  bone]
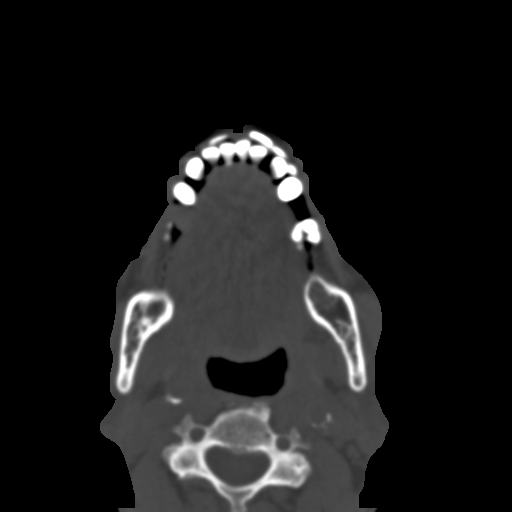
[im 29/85  bone]
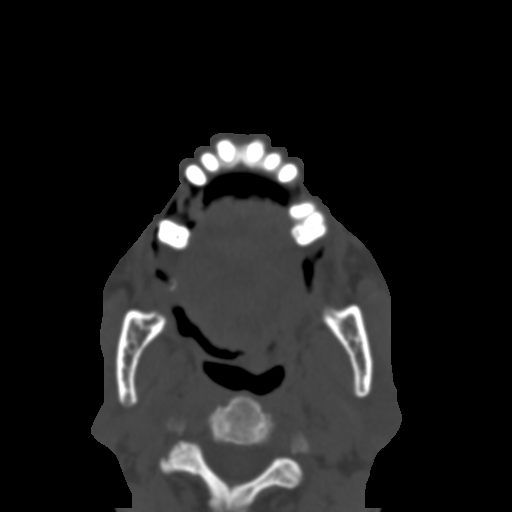
[im 38/85  brain]
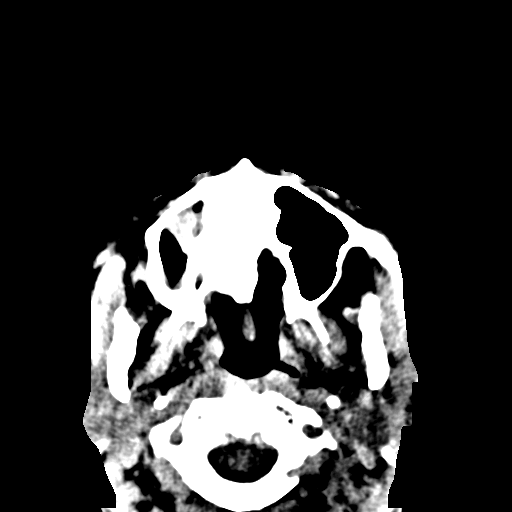
[im 38/85  bone]
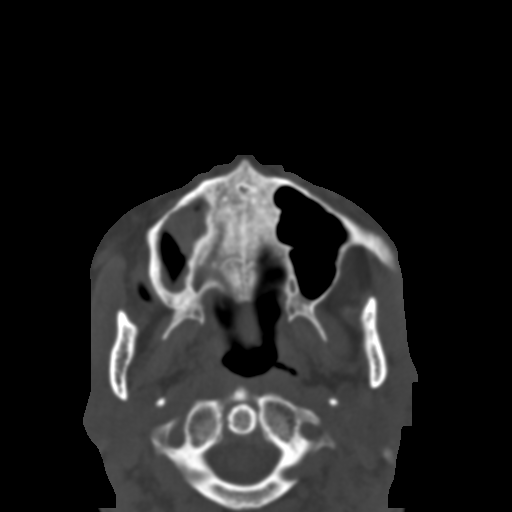
[im 47/85  bone]
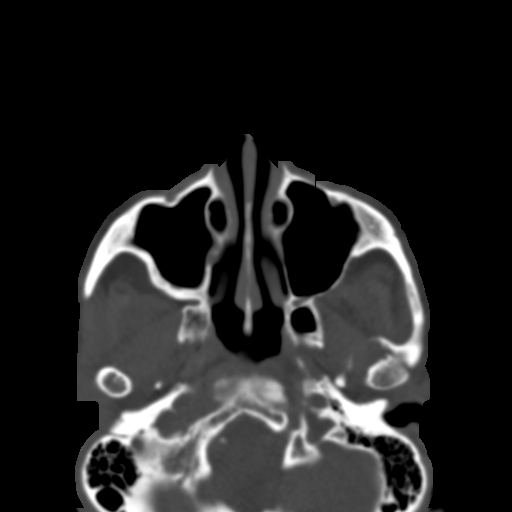
[im 56/85  bone]
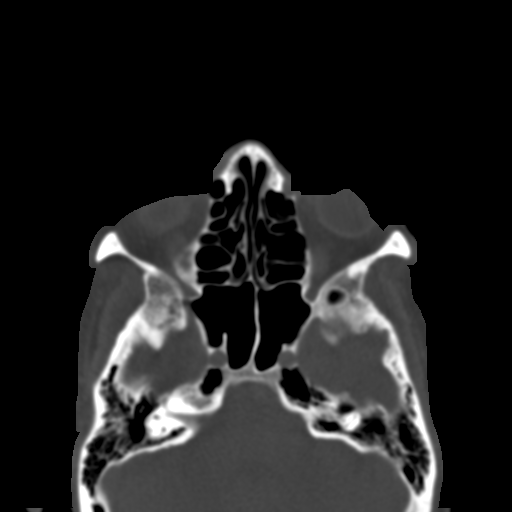
[im 64/85  bone]
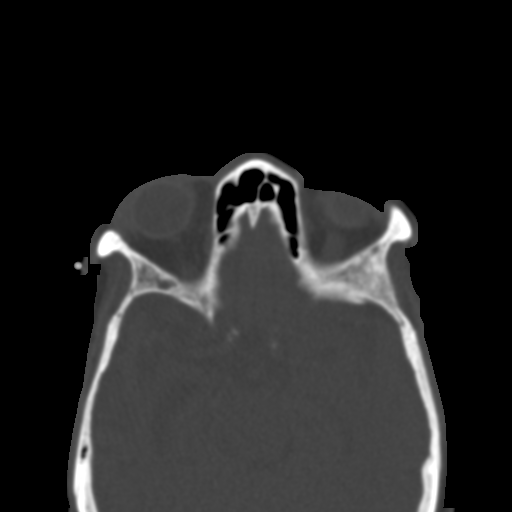
[im 70/85  brain]
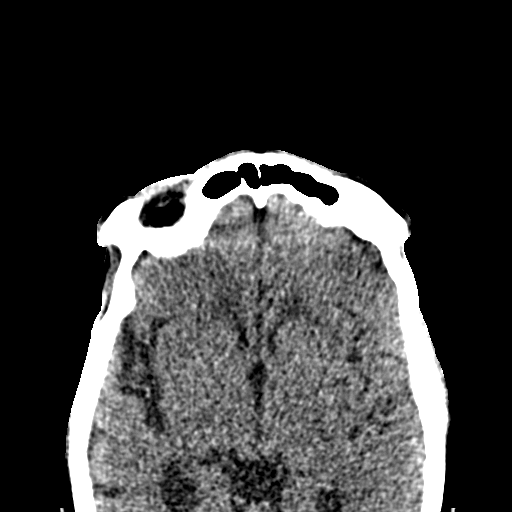
[im 70/85  bone]
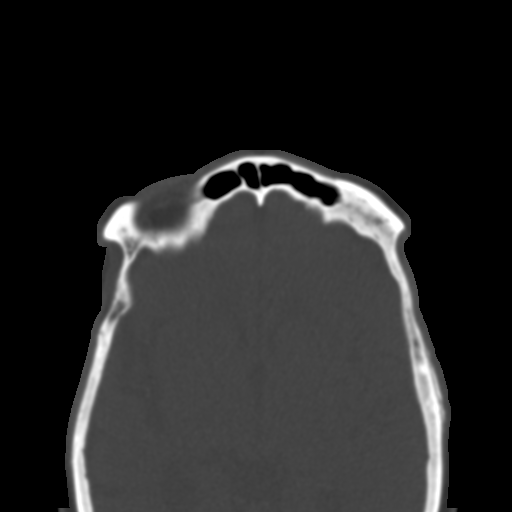
[im 79/85  bone]
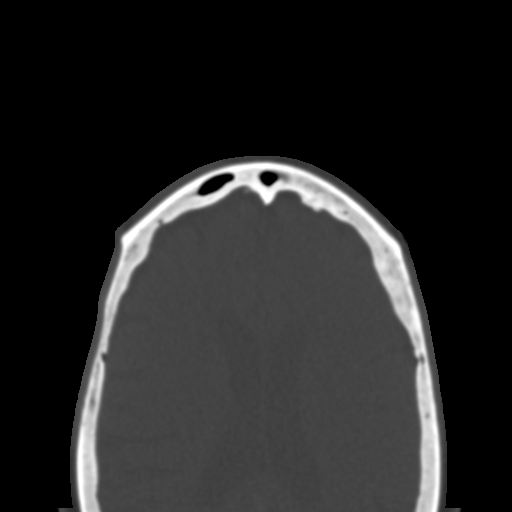

[Series 9: facialbone 2.0 cor st · coronal · 0.31mm/px · 3 of 75 slices shown]
[im 25/75  bone]
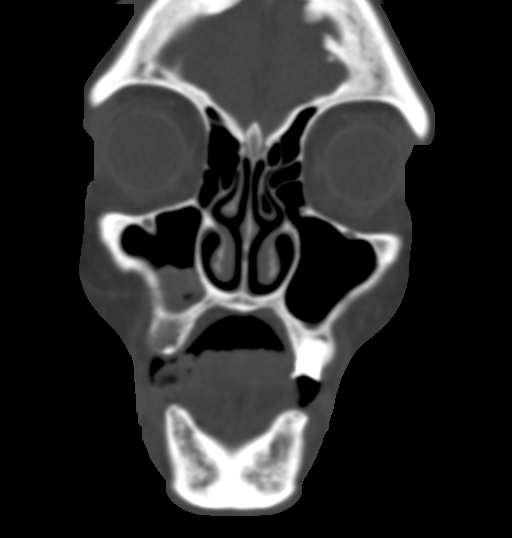
[im 33/75  bone]
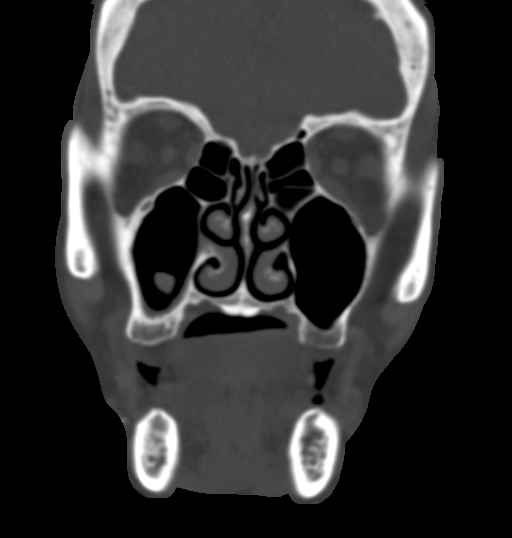
[im 42/75  bone]
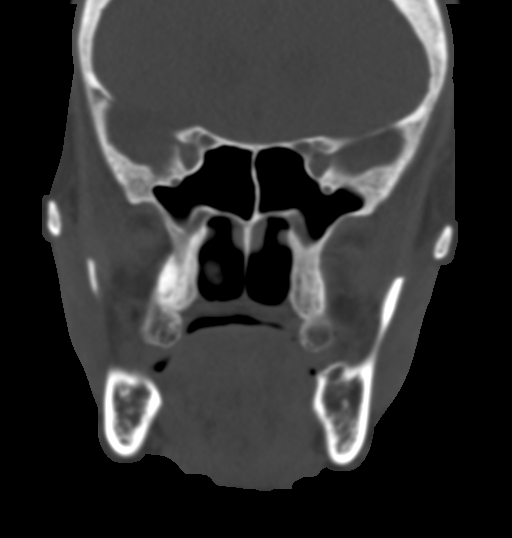

[Series 10: facialbone 2.0 sag st · sagittal · 0.32mm/px · 3 of 76 slices shown]
[im 26/76  bone]
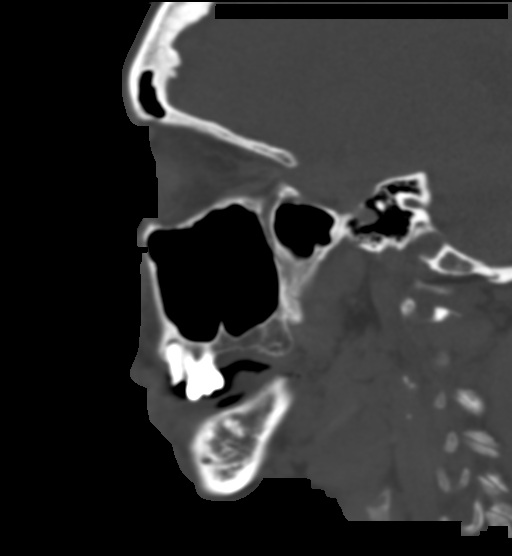
[im 38/76  bone]
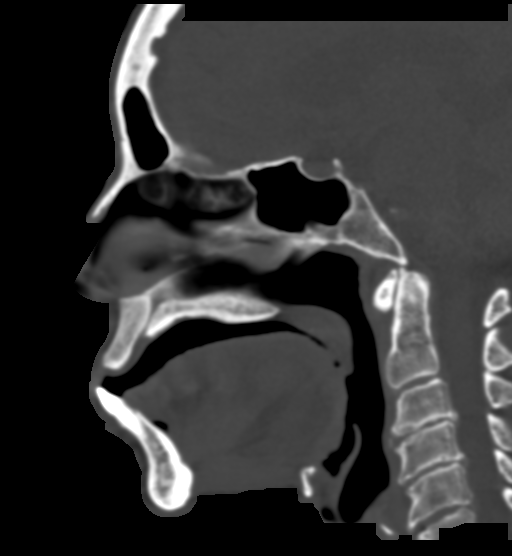
[im 51/76  bone]
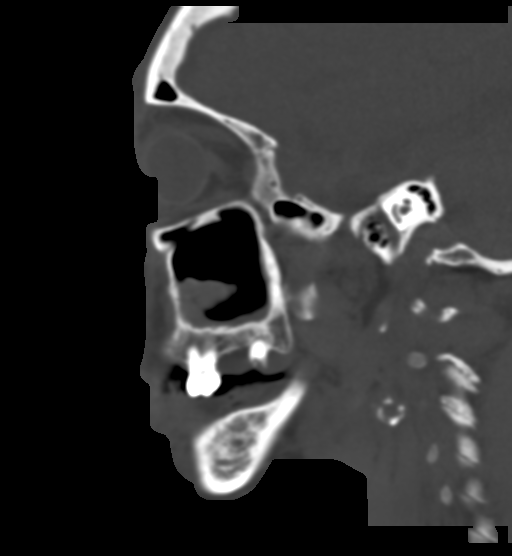

[16 of 47 positions shown; findings below may reference images not displayed]

FINDINGS: Osseous: Bilateral mandibular heads are normally position. Negative
for mandibular fracture. Mastoid air cells are clear. Zygomatic
arches, pterygoid plates and nasal bones appear intact.

Orbits: Negative. No traumatic or inflammatory finding.

Sinuses: Mucosal thickening in the right maxillary sinus. No acute
fluid levels. No sinus wall fracture

Soft tissues: Mild right periorbital soft tissue hematoma and
swelling over the right forehead.

Limited intracranial: No significant or unexpected finding.
IMPRESSION: 1. No definite acute displaced facial bone fracture
2. Mild right periorbital and forehead hematoma.

## 2018-09-05 ENCOUNTER — Ambulatory Visit: Payer: Medicare HMO | Admitting: Neurology

## 2018-09-05 ENCOUNTER — Encounter

## 2018-09-10 ENCOUNTER — Ambulatory Visit: Payer: Medicare HMO | Admitting: Neurology

## 2018-09-10 ENCOUNTER — Encounter: Payer: Self-pay | Admitting: Neurology

## 2018-09-10 VITALS — BP 131/79 | HR 65 | Ht 59.0 in | Wt 120.0 lb

## 2018-09-10 DIAGNOSIS — R413 Other amnesia: Secondary | ICD-10-CM | POA: Diagnosis not present

## 2018-09-10 DIAGNOSIS — G9340 Encephalopathy, unspecified: Secondary | ICD-10-CM | POA: Diagnosis not present

## 2018-09-10 DIAGNOSIS — F0391 Unspecified dementia with behavioral disturbance: Secondary | ICD-10-CM | POA: Diagnosis not present

## 2018-09-10 DIAGNOSIS — E538 Deficiency of other specified B group vitamins: Secondary | ICD-10-CM | POA: Diagnosis not present

## 2018-09-10 DIAGNOSIS — R945 Abnormal results of liver function studies: Secondary | ICD-10-CM | POA: Diagnosis not present

## 2018-09-10 DIAGNOSIS — D649 Anemia, unspecified: Secondary | ICD-10-CM | POA: Diagnosis not present

## 2018-09-10 DIAGNOSIS — F0390 Unspecified dementia without behavioral disturbance: Secondary | ICD-10-CM | POA: Diagnosis not present

## 2018-09-10 DIAGNOSIS — R899 Unspecified abnormal finding in specimens from other organs, systems and tissues: Secondary | ICD-10-CM | POA: Diagnosis not present

## 2018-09-10 NOTE — Progress Notes (Signed)
[facility]    Provider:  Dr Lucia Gaskins Referring Provider: Rinaldo Cloud, MD Primary Care Physician:  Donia Guiles, MD  CC:  Memory loss  HPI:  Cynthia Copeland is a 84 y.o. female here as requested by Rinaldo Cloud, MD for confusion and memory problems.  Per review of records, brother picked her up in the emergency room where she was seen for confusion in May and stated she has a history of dementia.  At that time she signed out AMA.  Per past medical history osteoarthritis, memory difficulty, hypertension, hypercholesterolemia, congestive heart failure, arthritis, atrial fibrillation, hyperthyroidism, elevated LFTs, history of breast cancer.  Patient doesn't kno why she is here. She is here with her neighbor. She has a diagnosis of dementia and ongoing on for years. Neighbor doesn't know. Patient is tangential, cannot get a history from patient. She lives with grandson. Lucila Maine gives her medication. Neighbor cooks for her and helps her. She cannot cook for herself. She needs help with medications. Neighbor has known her since 22. Started about 8-10 years ago she started losing her memory. Slowly progressively gotten worse. Started as short-term memory loss. No hallucinations. She has had falls but Dr, Sharyn Lull has initiated PT and she has a walker. She is mostly incoherent. Lucila Maine takes care of her. Unknown family history. Friend provides all information.    Reviewed notes, labs and imaging from outside physicians, which showed:  CT head 05/2017: personally reviewed images and agree with the following 1. Right frontal scalp hematoma without underlying fracture. 2. Chronic microvascular ischemic disease of periventricular white matter. 3. Chronic right caudate head lacunar infarct.  Reviewed: Hospital notes.  Patient was seen in the emergency room in May 2019.  She was confused in triage, poor historian and no one was with her.  She was dropped off by a friend.   Patient reported a fall when trying to sit down was not able to get up.  Patient seemed confused in the day she fell stating her shoulder hurt.  Patient quite tangential.    Reviewed notes from Dr. Sharyn Lull.  Patient was seen for complaints of musculoskeletal knee joint pain.  She has noncompliance to medications.  He arranged for home health aide and PT.  Most recent labs Feb 26, 2018 showed a BUN of 22, creatinine 0.98, GFR 52, CBC showed anemia hemoglobin 10, otherwise unremarkable  Review of Systems: Patient complains of symptoms per HPI as well as the following symptoms: memory loss. Pertinent negatives and positives per HPI. All others negative.   Social History   Socioeconomic History  . Marital status: Widowed    Spouse name: Not on file  . Number of children: Not on file  . Years of education: Not on file  . Highest education level: Not on file  Occupational History  . Not on file  Social Needs  . Financial resource strain: Not on file  . Food insecurity:    Worry: Not on file    Inability: Not on file  . Transportation needs:    Medical: Not on file    Non-medical: Not on file  Tobacco Use  . Smoking status: Former Smoker    Types: Cigarettes  . Smokeless tobacco: Never Used  Substance and Sexual Activity  . Alcohol use: No  . Drug use: No  . Sexual activity: Not Currently  Lifestyle  . Physical activity:    Days per week: Not on file    Minutes per session: Not on file  .  Stress: Not on file  Relationships  . Social connections:    Talks on phone: Not on file    Gets together: Not on file    Attends religious service: Not on file    Active member of club or organization: Not on file    Attends meetings of clubs or organizations: Not on file    Relationship status: Not on file  . Intimate partner violence:    Fear of current or ex partner: Not on file    Emotionally abused: Not on file    Physically abused: Not on file    Forced sexual activity: Not on  file  Other Topics Concern  . Not on file  Social History Narrative  . Not on file    No family history on file.  Past Medical History:  Diagnosis Date  . A-fib (HCC)   . Arthritis   . CHF (congestive heart failure) (HCC)   . Hypercholesteremia   . Hypertension   . Memory difficulty   . Osteoarthritis     Past Surgical History:  Procedure Laterality Date  . APPENDECTOMY    . BREAST SURGERY  2010   Rt. mastectomy  . JOINT REPLACEMENT  2011   left knee replacement  . TUBAL LIGATION      Current Outpatient Medications  Medication Sig Dispense Refill  . amLODipine (NORVASC) 5 MG tablet Take 5 mg by mouth daily. At noon    . apixaban (ELIQUIS) 5 MG TABS tablet Take 1 tablet (5 mg total) by mouth 2 (two) times daily. 60 tablet 3  . cloNIDine (CATAPRES) 0.1 MG tablet Take 0.1 mg by mouth daily.    Marland Kitchen. Cod Liver Oil CAPS Take 2 capsules by mouth daily.    Marland Kitchen. CRANBERRY PO Take 1 tablet by mouth daily.     . Cyanocobalamin (VITAMIN B 12 PO) Take 1 tablet by mouth daily.    . methimazole (TAPAZOLE) 10 MG tablet Take 1 tablet (10 mg total) by mouth daily. 30 tablet 3  . Multiple Vitamin (MULTIVITAMIN WITH MINERALS) TABS Take 1 tablet by mouth daily.    . Omega-3 Fatty Acids (OMEGA 3 PO) Take 1 capsule by mouth daily.     . ramipril (ALTACE) 5 MG capsule Take 1 capsule (5 mg total) by mouth daily. 30 capsule 3  . rosuvastatin (CRESTOR) 10 MG tablet Take 10 mg by mouth daily.     No current facility-administered medications for this visit.     Allergies as of 09/10/2018 - Review Complete 02/26/2018  Allergen Reaction Noted  . Penicillins Hives 05/14/2012    Vitals: BP 131/79 (BP Location: Right Arm, Patient Position: Sitting)   Pulse 65   Ht 4\' 11"  (1.499 m)   Wt 120 lb (54.4 kg)   BMI 24.24 kg/m  Last Weight:  Wt Readings from Last 1 Encounters:  09/10/18 120 lb (54.4 kg)   Last Height:   Ht Readings from Last 1 Encounters:  09/10/18 4\' 11"  (1.499 m)   Physical  exam: Exam: Gen: NAD              CV: RRR, no MRG. No Carotid Bruits. No peripheral edema, warm, nontender Eyes: Conjunctivae clear without exudates or hemorrhage  Neuro: Detailed Neurologic Exam  Speech:    Speech is normal; fluent and spontaneous with impaired comprehension.  Cognition:  MMSE - Mini Mental State Exam 09/10/2018  Orientation to time 2  Orientation to Place 3  Registration 3  Attention/ Calculation 3  Recall 0  Language- name 2 objects 2  Language- repeat 0  Language- follow 3 step command 3  Language- read & follow direction 1  Write a sentence 0  Copy design 0  Total score 17    Cranial Nerves:    The pupils are equal, round, and reactive to light.  Attempted fundoscopy could not visualize. Visual fields are full to threat. Extraocular movements are intact. Trigeminal sensation is intact and the muscles of mastication are normal. The face is symmetric. The palate elevates in the midline. Hearing intact. Voice is normal. Shoulder shrug is normal. The tongue has normal motion without fasciculations.   Coordination:    No dysmetria noted.   Gait:    Walker, wide based, slightly stooped  Motor Observation:    no involuntary movements noted. Tone:    Normal muscle tone.      Strength:    Strength is anti-gravity and equal cannot perform detailed strength test due to dementia     Sensation: intact to LT     Reflex Exam:  DTR's:  attempted, she pulled away and declied Toes:   Right upgoing, she refused left Clonus:    Clonus is absent.       Assessment/Plan:   TENNA LACKO is a 84 y.o. female here as requested by Rinaldo Cloud, MD for confusion and memory problems.  Per review of records, brother picked her up in the emergency room where she was seen for confusion in May and stated she has a history of dementia.  At that time she signed out AMA.  Per past medical history osteoarthritis, memory difficulty, hypertension,  hypercholesterolemia, congestive heart failure, arthritis, atrial fibrillation, hyperthyroidism, elevated LFTs, history of breast cancer.  - Patient with dementia, she is talkative but mostly incoherent.  - Grandson will call to schedule next appt, need him here for history - Dementia workup - at next appointment can consider Aricept but at this point she has mod to severe dementia will not likely make a clinical difference  Orders Placed This Encounter  Procedures  . MR BRAIN W WO CONTRAST  . B12 and Folate Panel  . Methylmalonic acid, serum  . Homocysteine  . RPR  . HIV Antibody (routine testing w rflx)  . Ammonia  . Vitamin B1     Cc: Rinaldo Cloud, MD  Naomie Dean, MD  [facility] [address] [place], [place] [address]  Phone [phone] Fax [phone]

## 2018-09-10 NOTE — Patient Instructions (Signed)
Labs MRI brain    Dementia Dementia is the loss of two or more brain functions, such as:  Memory.  Decision making.  Behavior.  Speaking.  Thinking.  Problem solving.  There are many types of dementia. The most common type is called progressive dementia. Progressive dementia gets worse with time and it is irreversible. An example of this type of dementia is Alzheimer disease. What are the causes? This condition may be caused by:  Nerve cell damage in the brain.  Genetic mutations.  Certain medicines.  Multiple small strokes.  An infection, such as chronic meningitis.  A metabolic problem, such as vitamin B12 deficiency or thyroid disease.  Pressure on the brain, such as from a tumor or blood clot.  What are the signs or symptoms? Symptoms of this condition include:  Sudden changes in mood.  Depression.  Problems with balance.  Changes in personality.  Poor short-term memory.  Agitation.  Delusions.  Hallucinations.  Having a hard time: ? Speaking thoughts. ? Finding words. ? Solving problems. ? Doing familiar tasks. ? Understanding familiar ideas.  How is this diagnosed? This condition is diagnosed with an assessment by your health care provider. During this assessment, your health care provider will talk with you and your family, friends, or caregivers about your symptoms. A thorough medical history will be taken, and you will have a physical exam and tests. Tests may include:  Lab tests, such as blood or urine tests.  Imaging tests, such as a CT scan, PET scan, or MRI.  A lumbar puncture. This test involves removing and testing a small amount of the fluid that surrounds the brain and spinal cord.  An electroencephalogram (EEG). In this test, small metal discs are used to measure electrical activity in the brain.  Memory tests, cognitive tests, and neuropsychological tests. These tests evaluate brain function.  How is this  treated? Treatment depends on the cause of the dementia. It may involve taking medicines that may help:  To control the dementia.  To slow down the disease.  To manage symptoms.  In some cases, treating the cause of the dementia can improve symptoms, reverse symptoms, or slow down how quickly the dementia gets worse. Your health care provider can help direct you to support groups, organizations, and other health care providers who can help with decisions about your care. Follow these instructions at home: Medicine  Take over-the-counter and prescription medicines only as told by your health care provider.  Avoid taking medicines that can affect thinking, such as pain or sleeping medicines. Lifestyle   Make healthy lifestyle choices: ? Be physically active as told by your health care provider. ? Do not use any tobacco products, such as cigarettes, chewing tobacco, and e-cigarettes. If you need help quitting, ask your health care provider. ? Eat a healthy diet. ? Practice stress-management techniques when you get stressed. ? Stay social.  Drink enough fluid to keep your urine clear or pale yellow.  Make sure to get quality sleep. These tips can help you to get a good night's rest: ? Avoid napping during the day. ? Keep your sleeping area dark and cool. ? Avoid exercising during the few hours before you go to bed. ? Avoid caffeine products in the evening. General instructions  Work with your health care provider to determine what you need help with and what your safety needs are.  If you were given a bracelet that tracks your location, make sure to wear it.  Keep all  follow-up visits as told by your health care provider. This is important. Contact a health care provider if:  You have any new symptoms.  You have problems with choking or swallowing.  You have any symptoms of a different illness. Get help right away if:  You develop a fever.  You have new or worsening  confusion.  You have new or worsening sleepiness.  You have a hard time staying awake.  You or your family members become concerned for your safety. This information is not intended to replace advice given to you by your health care provider. Make sure you discuss any questions you have with your health care provider. Document Released: 03/14/2001 Document Revised: 01/27/2016 Document Reviewed: 06/16/2015 Elsevier Interactive Patient Education  [employer]2018 [employer]

## 2018-09-12 ENCOUNTER — Telehealth: Payer: Self-pay | Admitting: Neurology

## 2018-09-12 DIAGNOSIS — F0390 Unspecified dementia without behavioral disturbance: Secondary | ICD-10-CM | POA: Insufficient documentation

## 2018-09-12 NOTE — Telephone Encounter (Signed)
Cynthia Copeland: [id number] (exp. 09/12/18 to 10/12/18) order sent to [facility]. They will reach out to the pt to schedule.

## 2018-09-12 NOTE — Telephone Encounter (Signed)
Patient grandson who is on the dpr is aware of this.

## 2018-09-17 ENCOUNTER — Telehealth: Payer: Self-pay | Admitting: *Deleted

## 2018-09-17 LAB — AMMONIA: Ammonia: 51 ug/dL (ref 19–87)

## 2018-09-17 LAB — B12 AND FOLATE PANEL
Folate: 19.4 ng/mL (ref >3.0)
Vitamin B-12: 743 pg/mL (ref 232–1245)

## 2018-09-17 LAB — VITAMIN B1: Thiamine: 85.5 nmol/L (ref 66.5–200.0)

## 2018-09-17 LAB — HIV ANTIBODY (ROUTINE TESTING W REFLEX): HIV Screen 4th Generation wRfx: NONREACTIVE

## 2018-09-17 LAB — HOMOCYSTEINE: Homocysteine: 10 umol/L (ref 0.0–15.0)

## 2018-09-17 LAB — METHYLMALONIC ACID, SERUM: METHYLMALONIC ACID: 166 nmol/L (ref 0–378)

## 2018-09-17 LAB — RPR: RPR: NONREACTIVE

## 2018-09-17 NOTE — Telephone Encounter (Signed)
-----   Message from Anson FretAntonia B Ahern, MD sent at 09/17/2018 10:19 AM EST ----- Labs normal thanks

## 2018-09-17 NOTE — Telephone Encounter (Signed)
Spoke with pt's grandson Stormy FabianJerry Stacker. Informed him that pt's labs are normal. He verbalized understanding. He stated that pt's MRI scheduled for 09/26/18. He is aware that Dr. Lucia GaskinsAhern would like for him to make next appt so he can come with pt. RN advised that she would call him back to schedule and discuss f/u time frame with Dr. Lucia GaskinsAhern. He was appreciative.

## 2018-09-19 DIAGNOSIS — M1711 Unilateral primary osteoarthritis, right knee: Secondary | ICD-10-CM | POA: Diagnosis not present

## 2018-09-19 DIAGNOSIS — M7582 Other shoulder lesions, left shoulder: Secondary | ICD-10-CM | POA: Diagnosis not present

## 2018-09-23 NOTE — Telephone Encounter (Signed)
[facility] thanks

## 2018-09-26 ENCOUNTER — Ambulatory Visit
Admission: RE | Admit: 2018-09-26 | Discharge: 2018-09-26 | Disposition: A | Discharge status: Home / Self Care | Payer: Medicare HMO | Source: Ambulatory Visit | Attending: Neurology | Admitting: Neurology

## 2018-09-26 DIAGNOSIS — G9340 Encephalopathy, unspecified: Secondary | ICD-10-CM

## 2018-09-26 NOTE — Telephone Encounter (Signed)
RN contacted the patient's grandson Dorene Sorrow(Jerry) and scheduled 6 month f/u with Tylene FantasiaMegan M, NP.  Visit scheduled for 02/25/2019 at 3 pm. Dorene SorrowJerry advised checkin time would be 2:30. Dorene SorrowJerry verbalized understanding and had no further questions/concerns at this time.  MB RN

## 2018-10-28 DIAGNOSIS — E785 Hyperlipidemia, unspecified: Secondary | ICD-10-CM | POA: Diagnosis not present

## 2018-10-28 DIAGNOSIS — I4891 Unspecified atrial fibrillation: Secondary | ICD-10-CM | POA: Diagnosis not present

## 2018-10-28 DIAGNOSIS — I5020 Unspecified systolic (congestive) heart failure: Secondary | ICD-10-CM | POA: Diagnosis not present

## 2018-10-28 DIAGNOSIS — M1990 Unspecified osteoarthritis, unspecified site: Secondary | ICD-10-CM | POA: Diagnosis not present

## 2018-10-28 DIAGNOSIS — I10 Essential (primary) hypertension: Secondary | ICD-10-CM | POA: Diagnosis not present

## 2018-10-28 DIAGNOSIS — E0590 Thyrotoxicosis, unspecified without thyrotoxic crisis or storm: Secondary | ICD-10-CM | POA: Diagnosis not present

## 2018-11-01 DIAGNOSIS — M170 Bilateral primary osteoarthritis of knee: Secondary | ICD-10-CM | POA: Diagnosis not present

## 2018-11-08 DIAGNOSIS — I5020 Unspecified systolic (congestive) heart failure: Secondary | ICD-10-CM | POA: Diagnosis not present

## 2018-11-08 DIAGNOSIS — I480 Paroxysmal atrial fibrillation: Secondary | ICD-10-CM | POA: Diagnosis not present

## 2018-11-08 DIAGNOSIS — I110 Hypertensive heart disease with heart failure: Secondary | ICD-10-CM | POA: Diagnosis not present

## 2018-11-08 DIAGNOSIS — Z7901 Long term (current) use of anticoagulants: Secondary | ICD-10-CM | POA: Diagnosis not present

## 2018-11-08 DIAGNOSIS — E0590 Thyrotoxicosis, unspecified without thyrotoxic crisis or storm: Secondary | ICD-10-CM | POA: Diagnosis not present

## 2018-11-08 DIAGNOSIS — F0390 Unspecified dementia without behavioral disturbance: Secondary | ICD-10-CM | POA: Diagnosis not present

## 2018-11-08 DIAGNOSIS — M1990 Unspecified osteoarthritis, unspecified site: Secondary | ICD-10-CM | POA: Diagnosis not present

## 2018-11-08 DIAGNOSIS — M25561 Pain in right knee: Secondary | ICD-10-CM | POA: Diagnosis not present

## 2018-11-08 DIAGNOSIS — E785 Hyperlipidemia, unspecified: Secondary | ICD-10-CM | POA: Diagnosis not present

## 2018-11-12 DIAGNOSIS — Z7901 Long term (current) use of anticoagulants: Secondary | ICD-10-CM | POA: Diagnosis not present

## 2018-11-12 DIAGNOSIS — E0590 Thyrotoxicosis, unspecified without thyrotoxic crisis or storm: Secondary | ICD-10-CM | POA: Diagnosis not present

## 2018-11-12 DIAGNOSIS — E785 Hyperlipidemia, unspecified: Secondary | ICD-10-CM | POA: Diagnosis not present

## 2018-11-12 DIAGNOSIS — I5020 Unspecified systolic (congestive) heart failure: Secondary | ICD-10-CM | POA: Diagnosis not present

## 2018-11-12 DIAGNOSIS — I110 Hypertensive heart disease with heart failure: Secondary | ICD-10-CM | POA: Diagnosis not present

## 2018-11-12 DIAGNOSIS — F0390 Unspecified dementia without behavioral disturbance: Secondary | ICD-10-CM | POA: Diagnosis not present

## 2018-11-12 DIAGNOSIS — M1990 Unspecified osteoarthritis, unspecified site: Secondary | ICD-10-CM | POA: Diagnosis not present

## 2018-11-12 DIAGNOSIS — M25561 Pain in right knee: Secondary | ICD-10-CM | POA: Diagnosis not present

## 2018-11-12 DIAGNOSIS — I480 Paroxysmal atrial fibrillation: Secondary | ICD-10-CM | POA: Diagnosis not present

## 2018-11-14 DIAGNOSIS — M25561 Pain in right knee: Secondary | ICD-10-CM | POA: Diagnosis not present

## 2018-11-14 DIAGNOSIS — I5020 Unspecified systolic (congestive) heart failure: Secondary | ICD-10-CM | POA: Diagnosis not present

## 2018-11-14 DIAGNOSIS — M1990 Unspecified osteoarthritis, unspecified site: Secondary | ICD-10-CM | POA: Diagnosis not present

## 2018-11-14 DIAGNOSIS — I480 Paroxysmal atrial fibrillation: Secondary | ICD-10-CM | POA: Diagnosis not present

## 2018-11-14 DIAGNOSIS — Z7901 Long term (current) use of anticoagulants: Secondary | ICD-10-CM | POA: Diagnosis not present

## 2018-11-14 DIAGNOSIS — F0390 Unspecified dementia without behavioral disturbance: Secondary | ICD-10-CM | POA: Diagnosis not present

## 2018-11-14 DIAGNOSIS — I110 Hypertensive heart disease with heart failure: Secondary | ICD-10-CM | POA: Diagnosis not present

## 2018-11-14 DIAGNOSIS — E0590 Thyrotoxicosis, unspecified without thyrotoxic crisis or storm: Secondary | ICD-10-CM | POA: Diagnosis not present

## 2018-11-14 DIAGNOSIS — E785 Hyperlipidemia, unspecified: Secondary | ICD-10-CM | POA: Diagnosis not present

## 2018-11-19 NOTE — Telephone Encounter (Signed)
I got an extension on the auth dates it now expires from 11/19/18 to 12/19/18 auth number stays the same. She is scheduled at [facility] for 11/26/18.

## 2018-11-20 DIAGNOSIS — Z7901 Long term (current) use of anticoagulants: Secondary | ICD-10-CM | POA: Diagnosis not present

## 2018-11-20 DIAGNOSIS — M1990 Unspecified osteoarthritis, unspecified site: Secondary | ICD-10-CM | POA: Diagnosis not present

## 2018-11-20 DIAGNOSIS — I480 Paroxysmal atrial fibrillation: Secondary | ICD-10-CM | POA: Diagnosis not present

## 2018-11-20 DIAGNOSIS — E0590 Thyrotoxicosis, unspecified without thyrotoxic crisis or storm: Secondary | ICD-10-CM | POA: Diagnosis not present

## 2018-11-20 DIAGNOSIS — I110 Hypertensive heart disease with heart failure: Secondary | ICD-10-CM | POA: Diagnosis not present

## 2018-11-20 DIAGNOSIS — F0390 Unspecified dementia without behavioral disturbance: Secondary | ICD-10-CM | POA: Diagnosis not present

## 2018-11-20 DIAGNOSIS — M25561 Pain in right knee: Secondary | ICD-10-CM | POA: Diagnosis not present

## 2018-11-20 DIAGNOSIS — I5020 Unspecified systolic (congestive) heart failure: Secondary | ICD-10-CM | POA: Diagnosis not present

## 2018-11-20 DIAGNOSIS — E785 Hyperlipidemia, unspecified: Secondary | ICD-10-CM | POA: Diagnosis not present

## 2018-11-22 DIAGNOSIS — M25561 Pain in right knee: Secondary | ICD-10-CM | POA: Diagnosis not present

## 2018-11-22 DIAGNOSIS — F0390 Unspecified dementia without behavioral disturbance: Secondary | ICD-10-CM | POA: Diagnosis not present

## 2018-11-22 DIAGNOSIS — I480 Paroxysmal atrial fibrillation: Secondary | ICD-10-CM | POA: Diagnosis not present

## 2018-11-22 DIAGNOSIS — I110 Hypertensive heart disease with heart failure: Secondary | ICD-10-CM | POA: Diagnosis not present

## 2018-11-22 DIAGNOSIS — I5020 Unspecified systolic (congestive) heart failure: Secondary | ICD-10-CM | POA: Diagnosis not present

## 2018-11-22 DIAGNOSIS — E785 Hyperlipidemia, unspecified: Secondary | ICD-10-CM | POA: Diagnosis not present

## 2018-11-22 DIAGNOSIS — M1990 Unspecified osteoarthritis, unspecified site: Secondary | ICD-10-CM | POA: Diagnosis not present

## 2018-11-22 DIAGNOSIS — E0590 Thyrotoxicosis, unspecified without thyrotoxic crisis or storm: Secondary | ICD-10-CM | POA: Diagnosis not present

## 2018-11-22 DIAGNOSIS — Z7901 Long term (current) use of anticoagulants: Secondary | ICD-10-CM | POA: Diagnosis not present

## 2018-11-26 ENCOUNTER — Inpatient Hospital Stay: Admission: RE | Admit: 2018-11-26 | Payer: Medicare HMO | Source: Ambulatory Visit

## 2018-11-26 ENCOUNTER — Other Ambulatory Visit: Payer: Medicare HMO

## 2018-12-26 DIAGNOSIS — M1711 Unilateral primary osteoarthritis, right knee: Secondary | ICD-10-CM | POA: Diagnosis not present

## 2019-01-05 DIAGNOSIS — I499 Cardiac arrhythmia, unspecified: Secondary | ICD-10-CM | POA: Diagnosis not present

## 2019-01-05 DIAGNOSIS — I469 Cardiac arrest, cause unspecified: Secondary | ICD-10-CM | POA: Diagnosis not present

## 2019-01-22 ENCOUNTER — Telehealth: Payer: Self-pay | Admitting: Neurology

## 2019-01-22 NOTE — Telephone Encounter (Signed)
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 Patient can be called and possibly offered a sooner appointment via virtual visit with Amy, NP.

## 2019-01-23 NOTE — Telephone Encounter (Signed)
From review of notes, 6 month f/u was scheduled with son, as he needs to be there.  I will change the appt to Shawnie Dapper, NP schedule.

## 2019-01-23 NOTE — Telephone Encounter (Signed)
Noted, thank you. DW  °

## 2019-01-23 NOTE — Telephone Encounter (Signed)
Called and LMVM for grandson, Dorene Sorrow.  Changed provider to (Shawnie Dapper, NP ) who is working with Dr. Lucia Gaskins. Kept same date and time.  Can be seen sooner if would like, (most likely virtual visit due to covid 19).

## 2019-01-31 DEATH — deceased

## 2019-02-18 ENCOUNTER — Telehealth: Payer: Self-pay

## 2019-02-18 NOTE — Telephone Encounter (Signed)
Cynthia Copeland stated that she passed away 2019/01/20.

## 2019-02-18 NOTE — Telephone Encounter (Signed)
Spoke with her grandson and he stated that his grandmother passed away in 2023-01-20. Appt has been cancelled.

## 2019-02-25 ENCOUNTER — Ambulatory Visit: Payer: Self-pay | Admitting: Family Medicine
